# Patient Record
Sex: Female | Born: 1965 | Race: White | Hispanic: No | Marital: Married | Smoking: Never smoker
Health system: Southern US, Community
[De-identification: ages and names within clinical notes are randomized; demographics above are authoritative.]

## PROBLEM LIST (undated history)

## (undated) DIAGNOSIS — F419 Anxiety disorder, unspecified: Secondary | ICD-10-CM

## (undated) DIAGNOSIS — Z85828 Personal history of other malignant neoplasm of skin: Secondary | ICD-10-CM

## (undated) DIAGNOSIS — E785 Hyperlipidemia, unspecified: Secondary | ICD-10-CM

## (undated) DIAGNOSIS — I10 Essential (primary) hypertension: Secondary | ICD-10-CM

## (undated) DIAGNOSIS — E119 Type 2 diabetes mellitus without complications: Secondary | ICD-10-CM

## (undated) DIAGNOSIS — E559 Vitamin D deficiency, unspecified: Secondary | ICD-10-CM

## (undated) DIAGNOSIS — C4490 Unspecified malignant neoplasm of skin, unspecified: Secondary | ICD-10-CM

## (undated) DIAGNOSIS — T7840XA Allergy, unspecified, initial encounter: Secondary | ICD-10-CM

## (undated) DIAGNOSIS — M2550 Pain in unspecified joint: Secondary | ICD-10-CM

## (undated) DIAGNOSIS — K5900 Constipation, unspecified: Secondary | ICD-10-CM

## (undated) DIAGNOSIS — Z9889 Other specified postprocedural states: Secondary | ICD-10-CM

## (undated) DIAGNOSIS — R7303 Prediabetes: Secondary | ICD-10-CM

## (undated) HISTORY — DX: Type 2 diabetes mellitus without complications: E11.9

## (undated) HISTORY — PX: SCLERAL BUCKLE: SHX5340

## (undated) HISTORY — PX: WRIST SURGERY: SHX841

## (undated) HISTORY — PX: SKIN CANCER EXCISION: SHX779

## (undated) HISTORY — DX: Pain in unspecified joint: M25.50

## (undated) HISTORY — PX: OTHER SURGICAL HISTORY: SHX169

## (undated) HISTORY — DX: Anxiety disorder, unspecified: F41.9

## (undated) HISTORY — DX: Essential (primary) hypertension: I10

## (undated) HISTORY — PX: VARICOSE VEIN SURGERY: SHX832

## (undated) HISTORY — DX: Allergy, unspecified, initial encounter: T78.40XA

## (undated) HISTORY — DX: Personal history of other malignant neoplasm of skin: Z85.828

## (undated) HISTORY — DX: Unspecified malignant neoplasm of skin, unspecified: C44.90

## (undated) HISTORY — DX: Constipation, unspecified: K59.00

## (undated) HISTORY — DX: Other specified postprocedural states: Z98.890

## (undated) HISTORY — PX: FRACTURE SURGERY: SHX138

## (undated) HISTORY — DX: Hyperlipidemia, unspecified: E78.5

## (undated) HISTORY — DX: Vitamin D deficiency, unspecified: E55.9

## (undated) HISTORY — PX: BREAST SURGERY: SHX581

---

## 2005-12-02 ENCOUNTER — Ambulatory Visit: Payer: Self-pay | Admitting: Family Medicine

## 2006-04-19 ENCOUNTER — Ambulatory Visit: Payer: Self-pay | Admitting: Family Medicine

## 2007-12-14 ENCOUNTER — Encounter (INDEPENDENT_AMBULATORY_CARE_PROVIDER_SITE_OTHER): Payer: Self-pay | Admitting: *Deleted

## 2010-12-06 ENCOUNTER — Ambulatory Visit: Payer: Self-pay | Admitting: General Surgery

## 2012-06-07 IMAGING — CR DG OUTSIDE FILMS CHEST
7 of 8 series · 7 of 8 positions shown · non-contrast
Comparison: none

[CC (1 of 7)]
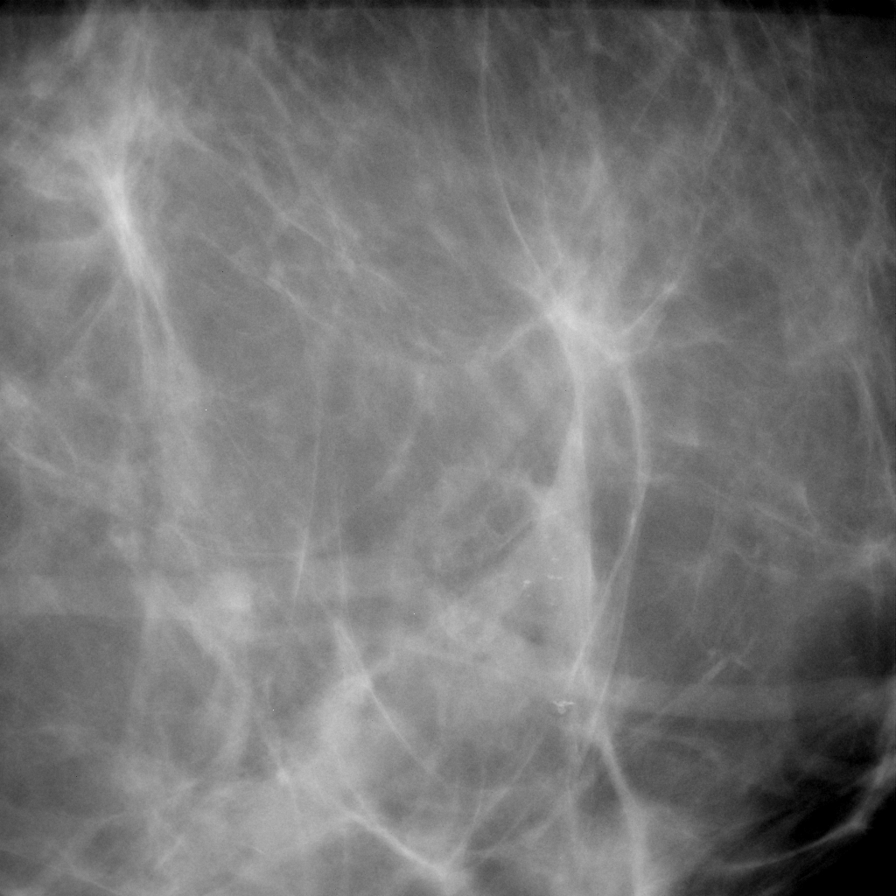

[CC (2 of 7)]
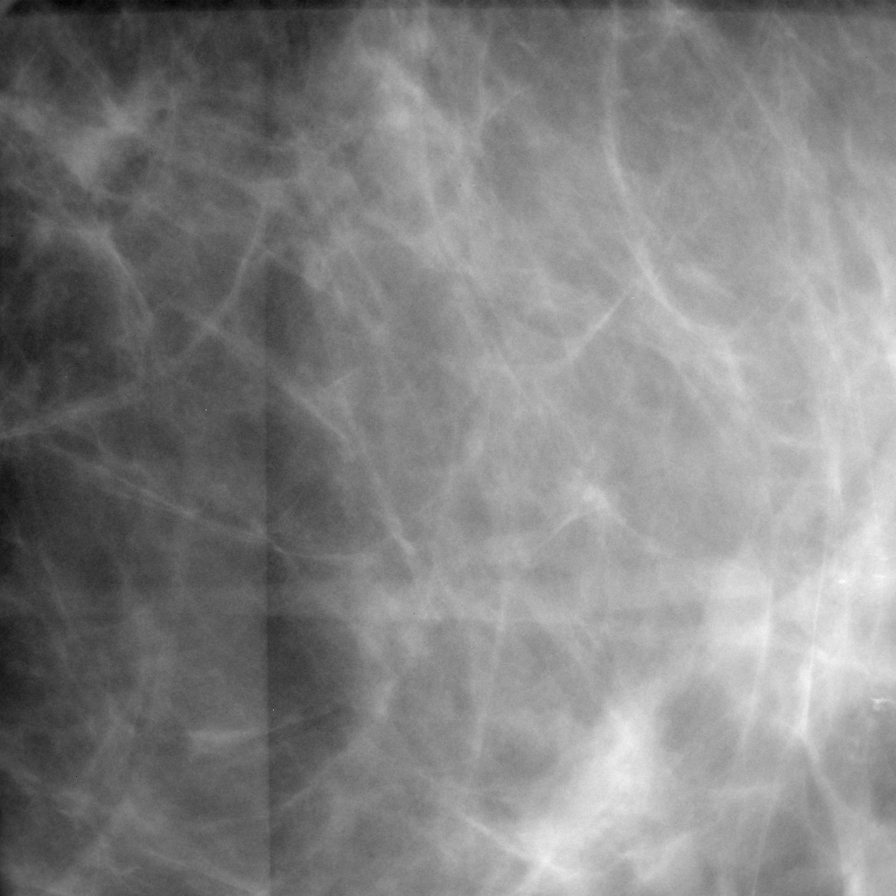

[CC (3 of 7)]
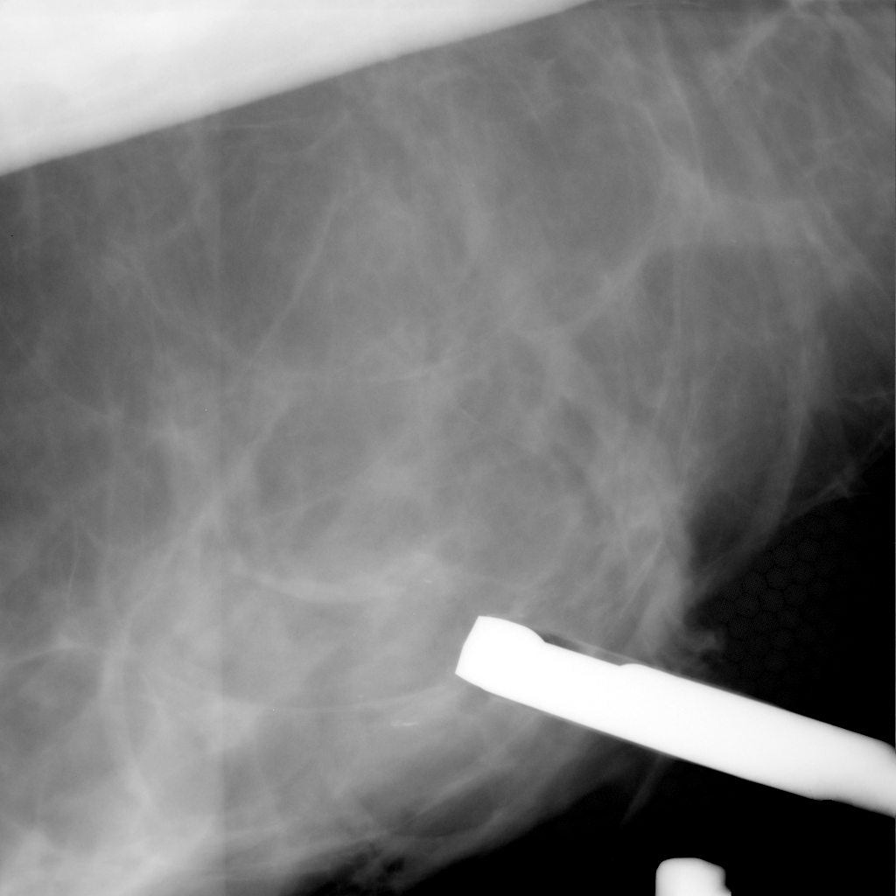

[CC (4 of 7)]
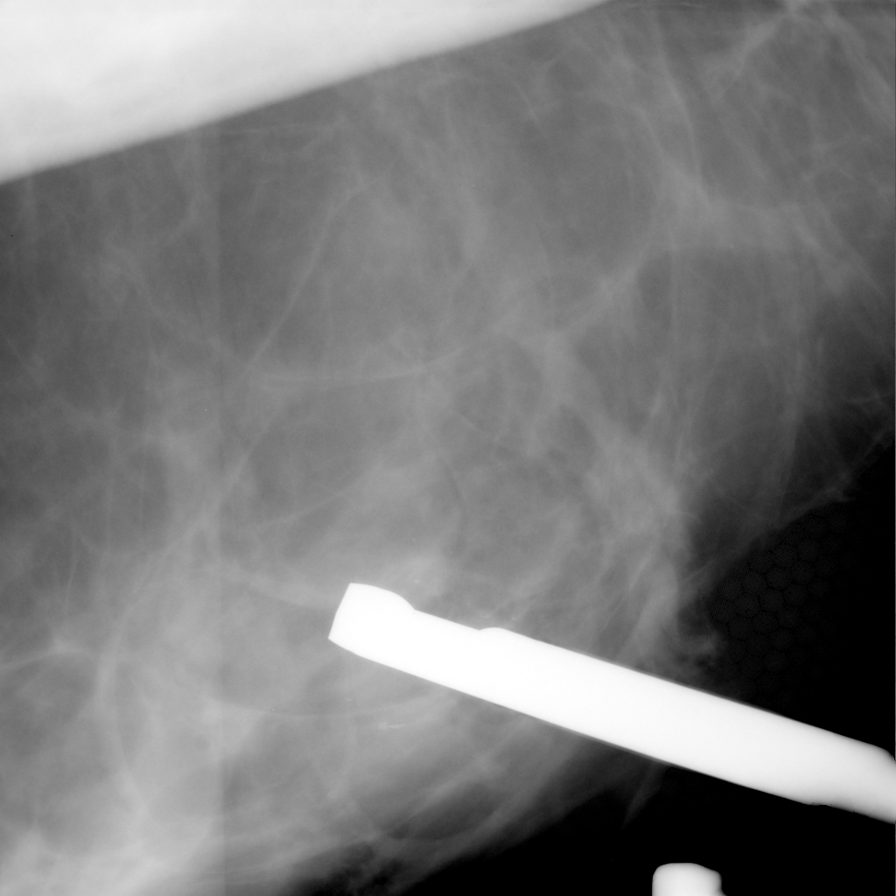

[CC (5 of 7)]
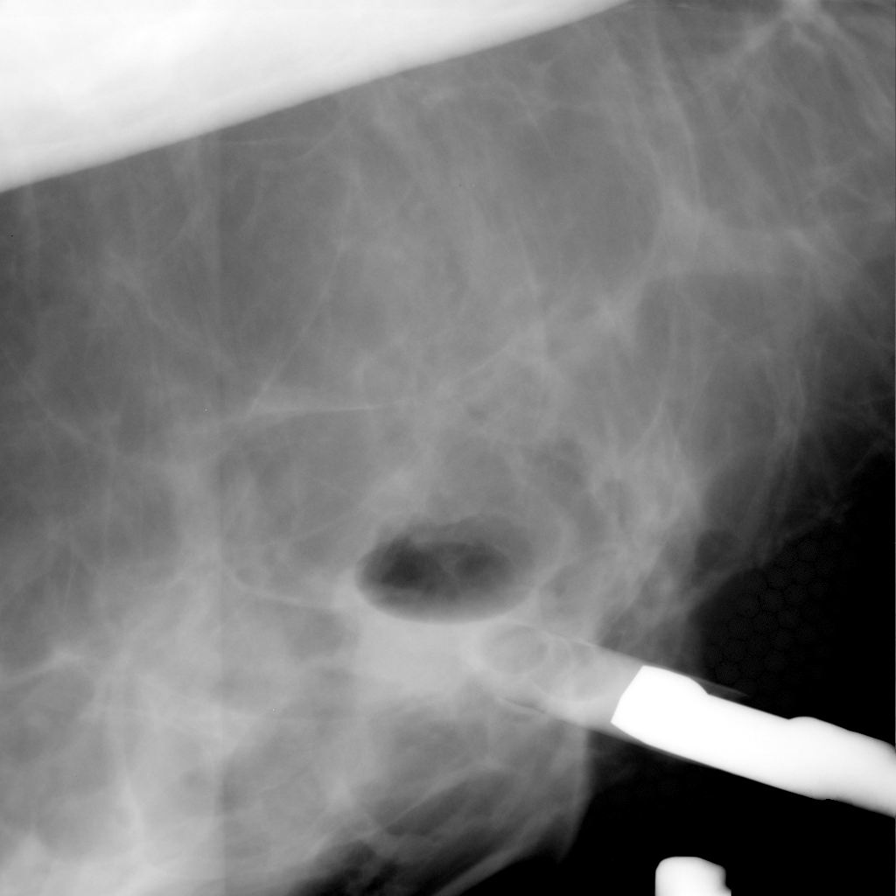

[CC (6 of 7)]
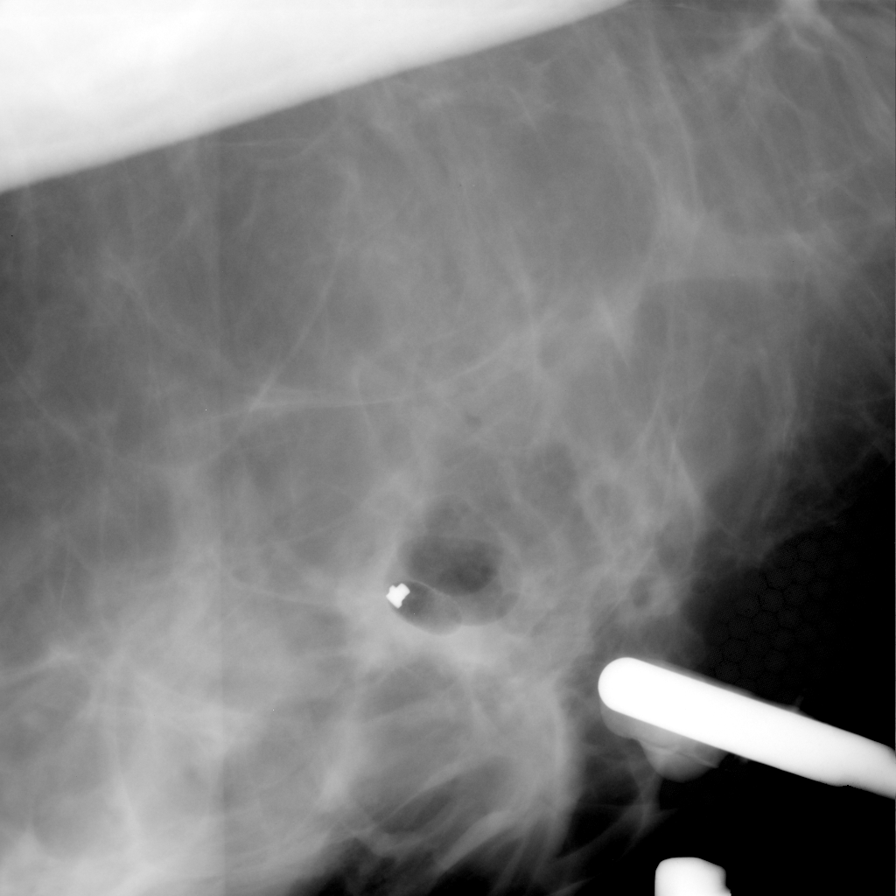

[CC (7 of 7)]
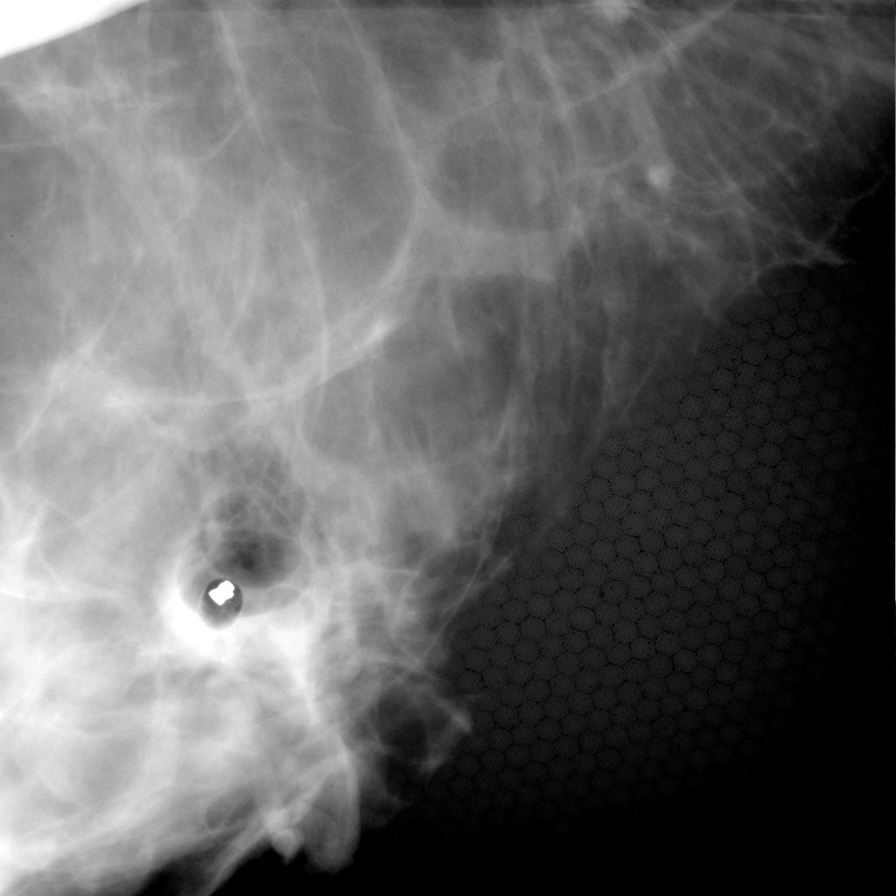

[7 of 8 positions shown; findings below may reference images not displayed]

**** An original report or order could not be provided from the [HOSPITAL] [facility] ****

## 2012-12-31 ENCOUNTER — Encounter: Payer: Self-pay | Admitting: General Surgery

## 2016-08-02 ENCOUNTER — Telehealth: Payer: Self-pay

## 2016-08-02 NOTE — Telephone Encounter (Signed)
Pt aware and will check on name of medication and get back to [place].

## 2016-08-02 NOTE — Telephone Encounter (Signed)
Pt can research vaccinations on CDC.gov website and get at [facility]. Pt to get me name of Rx for altitude sickness--unfamiliar with it. RN to notify pt.

## 2016-08-02 NOTE — Telephone Encounter (Signed)
Pt is planning a family trip to [place] in July and would like recommendations for immunizations that she may need as well as an rx for altitude sickness, she was unsure of the name but her daughter got an rx for it since she will be there most of the summer. Please advise. cb# [phone] thank you

## 2016-08-09 ENCOUNTER — Telehealth: Payer: Self-pay | Admitting: Obstetrics and Gynecology

## 2016-08-09 NOTE — Telephone Encounter (Signed)
RN to notify pt to check at [email or web address] for immunization recommendations and f/u with [facility] if she needs them. (I think she asked about this on previous msg as well).

## 2016-08-09 NOTE — Telephone Encounter (Signed)
Pt stated that she is going to be going out of the country and wanted to see if she was due for any immunizations. Please advise. Thanks TNP

## 2016-08-10 NOTE — Telephone Encounter (Signed)
Pt aware.

## 2016-08-10 NOTE — Telephone Encounter (Signed)
I do not know of a state database. I had advised her to contact the [facility] for recommendations since they have more knowledge regarding immunizations.

## 2016-08-10 NOTE — Telephone Encounter (Signed)
Pt states you was going to call her back after you checked the states database?

## 2016-10-13 ENCOUNTER — Ambulatory Visit (INDEPENDENT_AMBULATORY_CARE_PROVIDER_SITE_OTHER): Payer: Self-pay | Admitting: Vascular Surgery

## 2016-10-18 ENCOUNTER — Encounter: Payer: Self-pay | Admitting: Family

## 2016-10-18 ENCOUNTER — Ambulatory Visit (INDEPENDENT_AMBULATORY_CARE_PROVIDER_SITE_OTHER): Payer: Commercial Managed Care - PPO | Admitting: Family

## 2016-10-18 VITALS — BP 130/80 | HR 78 | Temp 98.7°F | Ht 67.0 in | Wt 246.6 lb

## 2016-10-18 DIAGNOSIS — E118 Type 2 diabetes mellitus with unspecified complications: Secondary | ICD-10-CM | POA: Diagnosis not present

## 2016-10-18 DIAGNOSIS — I10 Essential (primary) hypertension: Secondary | ICD-10-CM | POA: Diagnosis not present

## 2016-10-18 DIAGNOSIS — F411 Generalized anxiety disorder: Secondary | ICD-10-CM

## 2016-10-18 DIAGNOSIS — E119 Type 2 diabetes mellitus without complications: Secondary | ICD-10-CM | POA: Insufficient documentation

## 2016-10-18 LAB — BASIC METABOLIC PANEL
BUN: 10 mg/dL (ref 6–23)
CALCIUM: 9.1 mg/dL (ref 8.4–10.5)
CO2: 26 mEq/L (ref 19–32)
Chloride: 105 mEq/L (ref 96–112)
Creatinine, Ser: 0.59 mg/dL (ref 0.40–1.20)
GFR: 114.94 mL/min (ref >60.00)
GLUCOSE: 111 mg/dL — AB (ref 70–99)
POTASSIUM: 4.2 meq/L (ref 3.5–5.1)
Sodium: 137 mEq/L (ref 135–145)

## 2016-10-18 LAB — HEMOGLOBIN A1C: Hgb A1c MFr Bld: 6 % (ref 4.6–6.5)

## 2016-10-18 MED ORDER — AMLODIPINE BESYLATE 2.5 MG PO TABS: 2.5000 mg | ORAL_TABLET | Freq: Every day | ORAL | 0 refills | Status: DC

## 2016-10-18 NOTE — Assessment & Plan Note (Signed)
Elevated today however then came down after resting in room. Based on history, it appears to be  waxing and waning. Patient is very vigilant with blood pressure and I gave for threshold 130/80, and if persistently elevated, I would like her to start a low-dose amlodipine. Advised low salt, exercise as borderline, she may be able to treat with lifestyle modifications. Advised follow-up in 3 months.

## 2016-10-18 NOTE — Assessment & Plan Note (Signed)
Pending A1c. Appears controlled 

## 2016-10-18 NOTE — Progress Notes (Signed)
Subjective:    Patient ID: Mia Oconnell, female    DOB: [birth year], 51 y.o.   MRN: [id number]  CC: Mia Oconnell is a 51 y.o. female who presents today to establish care.    HPI: DM II- compliant with metformin. Doesn't check blood sugars.  anxiety- started years ago zoloft. No depression. No thoughts of hurting herself or anyone else.   H/o htn- had been on lisinopril a year ago for HTN. Ranges 146/90 to 102/80. Denies exertional chest pain or pressure, numbness or tingling radiating to left arm or jaw, palpitations, dizziness, frequent headaches, changes in vision, or shortness of breath.    6 with [facility] for pap, mammogram      HISTORY:  Past Medical History:  Diagnosis Date  . Allergy   . Diabetes mellitus without complication ([place])   . Hx of varicose vein stripping   . Hyperlipidemia   . Hypertension    Past Surgical History:  Procedure Laterality Date  . BREAST SURGERY    . SCLERAL BUCKLE    . VARICOSE VEIN SURGERY     Family History  Problem Relation Age of Onset  . Hyperlipidemia Mother   . Hypertension Mother   . Hyperlipidemia Father   . Hypertension Father   . Alcohol abuse Maternal Uncle   . Hyperlipidemia Maternal Grandmother   . Hypertension Maternal Grandmother   . Hyperlipidemia Maternal Grandfather   . Heart disease Maternal Grandfather   . Hypertension Maternal Grandfather   . Hyperlipidemia Paternal Grandmother   . Hypertension Paternal Grandmother   . Cancer Paternal Grandfather        colon  . Hyperlipidemia Paternal Grandfather   . Hypertension Paternal Grandfather     Allergies: Lisinopril No current outpatient prescriptions on file prior to visit.   No current facility-administered medications on file prior to visit.     Social History  Substance Use Topics  . Smoking status: Never Smoker  . Smokeless tobacco: Never Used  . Alcohol use No    Review of Systems  Constitutional: Negative for chills and  fever.  Respiratory: Negative for cough.   Cardiovascular: Negative for chest pain and palpitations.  Gastrointestinal: Negative for nausea and vomiting.  Psychiatric/Behavioral: Negative for suicidal ideas. The patient is nervous/anxious.       Objective:    BP 130/80   Pulse 78   Temp 98.7 F (37.1 C) (Oral)   Ht 5\' 7"  (1.702 m)   Wt 246 lb 9.6 oz (111.9 kg)   SpO2 98%   BMI 38.62 kg/m  BP Readings from Last 3 Encounters:  10/18/16 130/80   Wt Readings from Last 3 Encounters:  10/18/16 246 lb 9.6 oz (111.9 kg)    Physical Exam  Constitutional: She appears well-developed and well-nourished.  Eyes: Conjunctivae are normal.  Cardiovascular: Normal rate, regular rhythm, normal heart sounds and normal pulses.   Pulmonary/Chest: Effort normal and breath sounds normal. She has no wheezes. She has no rhonchi. She has no rales.  Neurological: She is alert.  Skin: Skin is warm and dry.  Psychiatric: She has a normal mood and affect. Her speech is normal and behavior is normal. Thought content normal.  Vitals reviewed.      Assessment & Plan:   Problem List Items Addressed This Visit      Cardiovascular and Mediastinum   Hypertension - Primary    Elevated today however then came down after resting in room. Based on history, it appears to be  waxing and waning. Patient is very vigilant with blood pressure and I gave for threshold 130/80, and if persistently elevated, I would like her to start a low-dose amlodipine. Advised low salt, exercise as borderline, she may be able to treat with lifestyle modifications. Advised follow-up in 3 months.                                                Relevant Medications   atorvastatin (LIPITOR) 20 MG tablet   aspirin 81 MG tablet   amLODipine (NORVASC) 2.5 MG tablet   Other Relevant Orders   Basic metabolic panel   Hemoglobin A1c     Endocrine   DM (diabetes mellitus) (HCC)    Pending A1c. Appears controlled.      Relevant  Medications   atorvastatin (LIPITOR) 20 MG tablet   metFORMIN (GLUCOPHAGE-XR) 500 MG 24 hr tablet   aspirin 81 MG tablet     Other   GAD (generalized anxiety disorder)    Doing well on Zoloft. We'll continue at this time.          I am having [name] start on amLODipine. I am also having her maintain her atorvastatin, metFORMIN, sertraline, aspirin, and cyanocobalamin.   Meds ordered this encounter  Medications  . atorvastatin (LIPITOR) 20 MG tablet    Sig: Take 20 mg by mouth daily.    Refill:  3  . metFORMIN (GLUCOPHAGE-XR) 500 MG 24 hr tablet    Sig: TAKE 1 TABLET EVERY DAY WITH THE EVENING MEAL    Refill:  3  . sertraline (ZOLOFT) 50 MG tablet    Sig: Take 50 mg by mouth daily.    Refill:  3  . aspirin 81 MG tablet    Sig: Take 81 mg by mouth daily.  . cyanocobalamin 2000 MCG tablet    Sig: Take 2,000 mcg by mouth every other day.  Marland Kitchen amLODipine (NORVASC) 2.5 MG tablet    Sig: Take 1 tablet (2.5 mg total) by mouth daily.    Dispense:  30 tablet    Refill:  0    Order Specific Question:   Supervising Provider    Answer:   Crecencio Mc [[id number]]    Return precautions given.   Risks, benefits, and alternatives of the medications and treatment plan prescribed today were discussed, and patient expressed understanding.   Education regarding symptom management and diagnosis given to patient on AVS.  Continue to follow with Burnard Hawthorne, FNP for routine health maintenance.   Mia Oconnell and I agreed with plan.   Mable Paris, FNP

## 2016-10-18 NOTE — Assessment & Plan Note (Addendum)
Doing well on Zoloft. We'll continue at this time.

## 2016-10-18 NOTE — Patient Instructions (Addendum)
Goal of blood pressure is less than 130/80. If higher than that, you may take amlodipine.   Follow up 3 months  Labs today

## 2016-10-18 NOTE — Progress Notes (Signed)
Pre visit review using our clinic review tool, if applicable. No additional management support is needed unless otherwise documented below in the visit note. 

## 2016-10-24 ENCOUNTER — Ambulatory Visit (INDEPENDENT_AMBULATORY_CARE_PROVIDER_SITE_OTHER): Payer: Commercial Managed Care - PPO | Admitting: Vascular Surgery

## 2016-10-24 ENCOUNTER — Encounter (INDEPENDENT_AMBULATORY_CARE_PROVIDER_SITE_OTHER): Payer: Self-pay | Admitting: Vascular Surgery

## 2016-10-24 VITALS — BP 135/88 | HR 78 | Resp 17 | Ht 67.0 in | Wt 247.0 lb

## 2016-10-24 DIAGNOSIS — I872 Venous insufficiency (chronic) (peripheral): Secondary | ICD-10-CM

## 2016-10-24 DIAGNOSIS — I10 Essential (primary) hypertension: Secondary | ICD-10-CM

## 2016-10-24 DIAGNOSIS — E785 Hyperlipidemia, unspecified: Secondary | ICD-10-CM | POA: Insufficient documentation

## 2016-10-24 DIAGNOSIS — M79606 Pain in leg, unspecified: Secondary | ICD-10-CM | POA: Insufficient documentation

## 2016-10-24 DIAGNOSIS — M79604 Pain in right leg: Secondary | ICD-10-CM | POA: Diagnosis not present

## 2016-10-24 DIAGNOSIS — M79605 Pain in left leg: Secondary | ICD-10-CM | POA: Diagnosis not present

## 2016-10-24 DIAGNOSIS — E782 Mixed hyperlipidemia: Secondary | ICD-10-CM

## 2016-10-24 DIAGNOSIS — I83813 Varicose veins of bilateral lower extremities with pain: Secondary | ICD-10-CM

## 2016-10-24 NOTE — Progress Notes (Signed)
MRN : [id number]  Mia Oconnell is a 51 y.o. (14-Jul-1965) female who presents with chief complaint of  Chief Complaint  Patient presents with  . [place]    Painful varicose veins  .  History of Present Illness: The patient is seen for evaluation of symptomatic varicose veins. The patient relates burning and stinging which worsened steadily throughout the course of the day, particularly with standing. The patient also notes an aching and throbbing pain over the varicosities, particularly with prolonged dependent positions. The symptoms are significantly improved with elevation.  The patient also notes that during hot weather the symptoms are greatly intensified. The patient states the pain from the varicose veins interferes with work, daily exercise, shopping and household maintenance.   Several weeks ago she experienced an area of STP on the right anterior thigh.  She was seen in an urgent care center and treated conservatively.  There is no history of DVT, PE or superficial thrombophlebitis. There is no history of ulceration or hemorrhage. The patient denies a significant family history of varicose veins.   The patient has not worn graduated compression in the past. At the present time the patient has not been using over-the-counter analgesics. There is no history of prior surgical intervention or sclerotherapy.    Current Meds  Medication Sig  . amLODipine (NORVASC) 2.5 MG tablet Take 1 tablet (2.5 mg total) by mouth daily.  Marland Kitchen aspirin 81 MG tablet Take 81 mg by mouth daily.  Marland Kitchen atorvastatin (LIPITOR) 20 MG tablet Take 20 mg by mouth daily.  . cholecalciferol (VITAMIN D) 1000 units tablet Take 8,000 Units by mouth once.  . metFORMIN (GLUCOPHAGE-XR) 500 MG 24 hr tablet TAKE 1 TABLET EVERY DAY WITH THE EVENING MEAL  . sertraline (ZOLOFT) 50 MG tablet Take 50 mg by mouth daily.    Past Medical History:  Diagnosis Date  . Allergy   . Diabetes mellitus without complication ([place])    . Hx of varicose vein stripping   . Hyperlipidemia   . Hypertension     Past Surgical History:  Procedure Laterality Date  . BREAST SURGERY    . SCLERAL BUCKLE    . VARICOSE VEIN SURGERY      Social History Social History  Substance Use Topics  . Smoking status: Never Smoker  . Smokeless tobacco: Never Used  . Alcohol use No    Family History Family History  Problem Relation Age of Onset  . Hyperlipidemia Mother   . Hypertension Mother   . Hyperlipidemia Father   . Hypertension Father   . Alcohol abuse Maternal Uncle   . Hyperlipidemia Maternal Grandmother   . Hypertension Maternal Grandmother   . Hyperlipidemia Maternal Grandfather   . Heart disease Maternal Grandfather   . Hypertension Maternal Grandfather   . Hyperlipidemia Paternal Grandmother   . Hypertension Paternal Grandmother   . Cancer Paternal Grandfather        colon  . Hyperlipidemia Paternal Grandfather   . Hypertension Paternal Grandfather     Allergies  Allergen Reactions  . Lisinopril     cough  . Penicillins   . Sulfa Antibiotics      REVIEW OF SYSTEMS (Negative unless checked)  Constitutional: [] Weight loss  [] Fever  [] Chills Cardiac: [] Chest pain   [] Chest pressure   [] Palpitations   [] Shortness of breath when laying flat   [] Shortness of breath with exertion. Vascular:  [] Pain in legs with walking   [x] Pain in legs with standing  [] History of DVT   []   Phlebitis   [x] Swelling in legs   [x] Varicose veins   [] Non-healing ulcers Pulmonary:   [] Uses home oxygen   [] Productive cough   [] Hemoptysis   [] Wheeze  [] COPD   [] Asthma Neurologic:  [] Dizziness   [] Seizures   [] History of stroke   [] History of TIA  [] Aphasia   [] Vissual changes   [] Weakness or numbness in arm   [] Weakness or numbness in leg Musculoskeletal:   [] Joint swelling   [] Joint pain   [] Low back pain Hematologic:  [] Easy bruising  [] Easy bleeding   [] Hypercoagulable state   [] Anemic Gastrointestinal:  [] Diarrhea   [] Vomiting   [] Gastroesophageal reflux/heartburn   [] Difficulty swallowing. Genitourinary:  [] Chronic kidney disease   [] Difficult urination  [] Frequent urination   [] Blood in urine Skin:  [] Rashes   [] Ulcers  Psychological:  [] History of anxiety   []  History of major depression.  Physical Examination  Vitals:   10/24/16 1408  BP: 135/88  Pulse: 78  Resp: 17  Weight: 247 lb (112 kg)  Height: 5\' 7"  (1.702 m)   Body mass index is 38.69 kg/m. Gen: WD/WN, NAD Head: [place]/AT, No temporalis wasting.  Ear/Nose/Throat: Hearing grossly intact, nares w/o erythema or drainage Eyes: PER, EOMI, sclera nonicteric.  Neck: Supple, no large masses.   Pulmonary:  Good air movement, no audible wheezing bilaterally, no use of accessory muscles.  Cardiac: RRR, no JVD Vascular: scattered varicosities present extensively greater than 5 mm bilaterally.  Mild venous stasis changes to the legs bilaterally.  2+ soft pitting edema Vessel Right Left  Radial Palpable Palpable  Ulnar Palpable Palpable  PT Palpable Palpable  DP Palpable Palpable  Gastrointestinal: Non-distended. No guarding/no peritoneal signs.  Musculoskeletal: M/S 5/5 throughout.  No deformity or atrophy.  Neurologic: CN 2-12 intact. Symmetrical.  Speech is fluent. Motor exam as listed above. Psychiatric: Judgment intact, Mood & affect appropriate for pt's clinical situation. Dermatologic: No rashes or ulcers noted.  No changes consistent with cellulitis. Lymph : No lichenification or skin changes of chronic lymphedema.  CBC No results found for: WBC, HGB, HCT, MCV, PLT  BMET    Component Value Date/Time   NA 137 10/18/2016 1132   K 4.2 10/18/2016 1132   CL 105 10/18/2016 1132   CO2 26 10/18/2016 1132   GLUCOSE 111 (H) 10/18/2016 1132   BUN 10 10/18/2016 1132   CREATININE 0.59 10/18/2016 1132   CALCIUM 9.1 10/18/2016 1132   Estimated Creatinine Clearance: 109.8 mL/min (by C-G formula based on SCr of 0.59 mg/dL).  COAG No results found for:  INR, PROTIME  Radiology No results found.  Assessment/Plan 1. Pain in both lower extremities  Recommend:  The patient is complaining of symptomatic varicose veins.  The patient describes them as painful, associated with swelling of both ankles and notes  this is interfering with daily activities and lifestyle.  I have had a long discussion with the patient regarding  varicose veins and why they cause symptoms.  Patient will begin wearing graduated compression stockings class 1 on a daily basis, beginning first thing in the morning and removing them in the evening. The patient is instructed specifically not to sleep in the stockings.    The patient  will also begin using over-the-counter analgesics such as Motrin 600 mg po TID to help control the symptoms.    In addition, behavioral modification including elevation during the day will be initiated.  The patient is also instructed to continue exercising such as walking 4-5 times per week.   2.  Chronic venous insufficiency No surgery or intervention at this point in time.    I have had a long discussion with the patient regarding venous insufficiency and why it  causes symptoms. I have discussed with the patient the chronic skin changes that accompany venous insufficiency and the long term sequela such as infection and ulceration.  Patient will begin wearing graduated compression stockings class 1 (20-30 mmHg) or compression wraps on a daily basis a prescription was given. The patient will put the stockings on first thing in the morning and removing them in the evening. The patient is instructed specifically not to sleep in the stockings.    In addition, behavioral modification including several periods of elevation of the lower extremities during the day will be continued. I have demonstrated that proper elevation is a position with the ankles at heart level.  The patient is instructed to begin routine exercise, especially walking on a daily  basis  Following the review of the ultrasound the patient will follow up in 2-3 months to reassess the degree of swelling and the control that graduated compression stockings or compression wraps  is offering.   The patient can be assessed for a Lymph Pump at that time  3. Varicose veins of both lower extremities with pain See #1&2  4. Essential hypertension Continue antihypertensive medications as already ordered, these medications have been reviewed and there are no changes at this time.   5. Mixed hyperlipidemia Continue statin as ordered and reviewed, no changes at this time     Hortencia Pilar, MD  10/24/2016 9:10 PM

## 2016-11-01 ENCOUNTER — Encounter (INDEPENDENT_AMBULATORY_CARE_PROVIDER_SITE_OTHER): Payer: Self-pay | Admitting: Vascular Surgery

## 2016-12-05 ENCOUNTER — Ambulatory Visit (INDEPENDENT_AMBULATORY_CARE_PROVIDER_SITE_OTHER): Payer: Commercial Managed Care - PPO | Admitting: Vascular Surgery

## 2016-12-18 ENCOUNTER — Other Ambulatory Visit: Payer: Self-pay | Admitting: Family

## 2016-12-18 DIAGNOSIS — I10 Essential (primary) hypertension: Secondary | ICD-10-CM

## 2017-01-19 ENCOUNTER — Ambulatory Visit: Payer: Commercial Managed Care - PPO | Admitting: Family

## 2017-03-02 ENCOUNTER — Other Ambulatory Visit: Payer: Self-pay | Admitting: Obstetrics and Gynecology

## 2017-03-06 ENCOUNTER — Ambulatory Visit: Payer: Commercial Managed Care - PPO | Admitting: Internal Medicine

## 2017-03-22 ENCOUNTER — Ambulatory Visit: Payer: Commercial Managed Care - PPO | Admitting: Obstetrics and Gynecology

## 2017-04-06 ENCOUNTER — Ambulatory Visit: Payer: Commercial Managed Care - PPO | Admitting: Internal Medicine

## 2017-04-17 ENCOUNTER — Other Ambulatory Visit: Payer: Self-pay | Admitting: Obstetrics and Gynecology

## 2017-05-04 ENCOUNTER — Ambulatory Visit: Payer: Commercial Managed Care - PPO | Admitting: Obstetrics and Gynecology

## 2017-05-16 ENCOUNTER — Other Ambulatory Visit: Payer: Self-pay | Admitting: Obstetrics and Gynecology

## 2017-05-16 MED ORDER — ATORVASTATIN CALCIUM 20 MG PO TABS: 20.0000 mg | ORAL_TABLET | Freq: Every day | ORAL | 0 refills | Status: DC

## 2017-05-29 ENCOUNTER — Other Ambulatory Visit: Payer: Self-pay | Admitting: Obstetrics and Gynecology

## 2017-05-29 MED ORDER — METFORMIN HCL ER 500 MG PO TB24: ORAL_TABLET | ORAL | 0 refills | Status: DC

## 2017-05-29 NOTE — Progress Notes (Signed)
Rx RF till annual/labs.

## 2017-06-19 ENCOUNTER — Ambulatory Visit: Payer: Commercial Managed Care - PPO | Admitting: Obstetrics and Gynecology

## 2017-06-26 ENCOUNTER — Ambulatory Visit (INDEPENDENT_AMBULATORY_CARE_PROVIDER_SITE_OTHER): Payer: Commercial Managed Care - PPO | Admitting: Obstetrics and Gynecology

## 2017-06-26 ENCOUNTER — Encounter: Payer: Self-pay | Admitting: Obstetrics and Gynecology

## 2017-06-26 ENCOUNTER — Other Ambulatory Visit: Payer: Self-pay

## 2017-06-26 VITALS — BP 122/84 | HR 88 | Ht 67.0 in | Wt 248.0 lb

## 2017-06-26 DIAGNOSIS — Z1231 Encounter for screening mammogram for malignant neoplasm of breast: Secondary | ICD-10-CM | POA: Diagnosis not present

## 2017-06-26 DIAGNOSIS — Z01419 Encounter for gynecological examination (general) (routine) without abnormal findings: Secondary | ICD-10-CM

## 2017-06-26 DIAGNOSIS — E119 Type 2 diabetes mellitus without complications: Secondary | ICD-10-CM | POA: Insufficient documentation

## 2017-06-26 DIAGNOSIS — N938 Other specified abnormal uterine and vaginal bleeding: Secondary | ICD-10-CM

## 2017-06-26 DIAGNOSIS — Z760 Encounter for issue of repeat prescription: Secondary | ICD-10-CM

## 2017-06-26 DIAGNOSIS — F419 Anxiety disorder, unspecified: Secondary | ICD-10-CM | POA: Diagnosis not present

## 2017-06-26 DIAGNOSIS — Z1211 Encounter for screening for malignant neoplasm of colon: Secondary | ICD-10-CM | POA: Diagnosis not present

## 2017-06-26 DIAGNOSIS — Z1239 Encounter for other screening for malignant neoplasm of breast: Secondary | ICD-10-CM

## 2017-06-26 MED ORDER — METFORMIN HCL ER 500 MG PO TB24: ORAL_TABLET | ORAL | 0 refills | Status: DC

## 2017-06-26 MED ORDER — SERTRALINE HCL 50 MG PO TABS: 50.0000 mg | ORAL_TABLET | Freq: Every day | ORAL | 3 refills | Status: DC

## 2017-06-26 MED ORDER — ATORVASTATIN CALCIUM 20 MG PO TABS: 20.0000 mg | ORAL_TABLET | Freq: Every day | ORAL | 0 refills | Status: DC

## 2017-06-26 NOTE — Patient Instructions (Addendum)
I value your feedback and entrusting us with your care. If you get a [facility] [address] patient survey, I would appreciate you taking the time to let us know about your experience today. Thank you! 

## 2017-06-26 NOTE — Progress Notes (Signed)
PCP: Burnard Hawthorne, FNP   Chief Complaint  Patient presents with  . Gynecologic Exam    No Complaints    HPI:      Ms. Mia Oconnell is a 52 y.o. G0P0000 who LMP was Patient's last menstrual period was 06/20/2017., presents today for her annual examination.  Her menses are regular every 28-30 days, lasting 4 days, med flow.  Dysmenorrhea none. She has had intermenstrual bleeding, lasting 3-4 days, for the past 4-5 months.  Sex activity: single partner, contraception - condoms. She does not have vaginal dryness.  Last Pap: March 09, 2015  Results were: no abnormalities /neg HPV DNA.  Hx of STDs: none  Last mammogram: 03/28/16, [facility] 2 There is no FH of breast cancer. There is no FH of ovarian cancer. The patient does do self-breast exams.  Colonoscopy: never; due this yr  Tobacco use: The patient denies current or previous tobacco use. Alcohol use: none Exercise: moderately active  She does get adequate calcium and Vitamin D in her diet.  Labs/med problems managed by PCP now. Has appt in 6/19 and pt needs 1 more mo Rx to get her to appt. She is on atorvastatin for hyperlipidemia and metformin for type 2 DM. Pt was on lisinopril for HTN/kidney protection due to DM, but developed ACEI cough. Pt's BP has been great recently. She has norvasc prn sx.   Pt takes sertraline 50 mg daily for anxiety with sx control, and I manage this. Wants to cont meds.   Past Medical History:  Diagnosis Date  . Allergy   . Anxiety   . Diabetes mellitus without complication ([place])   . Hx of varicose vein stripping   . Hyperlipidemia   . Hypertension   . Vitamin D deficiency     Past Surgical History:  Procedure Laterality Date  . BREAST SURGERY    . SCLERAL BUCKLE    . VARICOSE VEIN SURGERY      Family History  Problem Relation Age of Onset  . Hyperlipidemia Mother   . Hypertension Mother   . Basal cell carcinoma Mother 72  . Hyperlipidemia Father   . Hypertension Father     . Alcohol abuse Maternal Uncle   . Hyperlipidemia Maternal Grandmother   . Hypertension Maternal Grandmother   . Hyperlipidemia Maternal Grandfather   . Heart disease Maternal Grandfather   . Hypertension Maternal Grandfather   . Hyperlipidemia Paternal Grandmother   . Hypertension Paternal Grandmother   . Cancer Paternal Grandfather        colon  . Hyperlipidemia Paternal Grandfather   . Hypertension Paternal Grandfather     Social History   Socioeconomic History  . Marital status: Married    Spouse name: Not on file  . Number of children: Not on file  . Years of education: Not on file  . Highest education level: Not on file  Occupational History  . Not on file  Social Needs  . Financial resource strain: Not on file  . Food insecurity:    Worry: Not on file    Inability: Not on file  . Transportation needs:    Medical: Not on file    Non-medical: Not on file  Tobacco Use  . Smoking status: Never Smoker  . Smokeless tobacco: Never Used  Substance and Sexual Activity  . Alcohol use: No  . Drug use: No  . Sexual activity: Never  Lifestyle  . Physical activity:    Days per week: 0 days  Minutes per session: Not on file  . Stress: Not on file  Relationships  . Social connections:    Talks on phone: Not on file    Gets together: Not on file    Attends religious service: Not on file    Active member of club or organization: Not on file    Attends meetings of clubs or organizations: Not on file    Relationship status: Not on file  . Intimate partner violence:    Fear of current or ex partner: Not on file    Emotionally abused: Not on file    Physically abused: Not on file    Forced sexual activity: Not on file  Other Topics Concern  . 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 cholecalciferol (VITAMIN D) 1000 units tablet Take 8,000 Units  by mouth once.    . loratadine (CLARITIN) 10 MG tablet Take 10 mg by mouth daily.    Marland Kitchen atorvastatin (LIPITOR) 20 MG tablet Take 1 tablet (20 mg total) by mouth daily. 90 tablet 0  . metFORMIN (GLUCOPHAGE-XR) 500 MG 24 hr tablet TAKE 1 TABLET EVERY DAY WITH THE EVENING MEAL 30 tablet 0  . sertraline (ZOLOFT) 50 MG tablet TAKE 1 TABLET EVERY DAY 90 tablet 0  . amLODipine (NORVASC) 2.5 MG tablet TAKE 1 TABLET BY MOUTH EVERY DAY (Patient not taking: Reported on 06/26/2017) 30 tablet 0  . aspirin 81 MG tablet Take 81 mg by mouth daily.     No facility-administered medications prior to visit.        ROS:  Review of Systems  Constitutional: Negative for fatigue, fever and unexpected weight change.  Respiratory: Negative for cough, shortness of breath and wheezing.   Cardiovascular: Negative for chest pain, palpitations and leg swelling.  Gastrointestinal: Negative for blood in stool, constipation, diarrhea, nausea and vomiting.  Endocrine: Negative for cold intolerance, heat intolerance and polyuria.  Genitourinary: Negative for dyspareunia, dysuria, flank pain, frequency, genital sores, hematuria, menstrual problem, pelvic pain, urgency, vaginal bleeding, vaginal discharge and vaginal pain.  Musculoskeletal: Negative for back pain, joint swelling and myalgias.  Skin: Negative for rash.  Neurological: Negative for dizziness, syncope, light-headedness, numbness and headaches.  Hematological: Negative for adenopathy.  Psychiatric/Behavioral: Negative for agitation, confusion, sleep disturbance and suicidal ideas. The patient is not nervous/anxious.    BREAST: No symptoms    Objective: BP 122/84 (BP Location: Left Arm, Patient Position: Sitting, Cuff Size: Large)   Pulse 88   Ht 5\' 7"  (1.702 m)   Wt 248 lb (112.5 kg)   LMP 06/20/2017   BMI 38.84 kg/m    Physical Exam  Constitutional: She is oriented to person, place, and time. She appears well-developed and well-nourished.   Genitourinary: Vagina normal and uterus normal. There is no rash or tenderness on the right labia. There is no rash or tenderness on the left labia. No erythema or tenderness in the vagina. No vaginal discharge found. Right adnexum does not display mass and does not display tenderness. Left adnexum does not display mass and does not display tenderness. Cervix does not exhibit motion tenderness or polyp. Uterus is not enlarged or tender.  Neck: Normal range of motion. No thyromegaly present.  Cardiovascular: Normal rate, regular rhythm and normal heart sounds.  No murmur heard. Pulmonary/Chest: Effort normal and breath sounds normal. Right breast exhibits  no mass, no nipple discharge, no skin change and no tenderness. Left breast exhibits no mass, no nipple discharge, no skin change and no tenderness.  Abdominal: Soft. There is no tenderness. There is no guarding.  Musculoskeletal: Normal range of motion.  Neurological: She is alert and oriented to person, place, and time. No cranial nerve deficit.  Psychiatric: She has a normal mood and affect. Her behavior is normal.  Vitals reviewed.  Assessment/Plan:  Encounter for annual routine gynecological examination  Screening for breast cancer - Pt to sched mammo. - Plan: MM DIGITAL SCREENING BILATERAL  DUB (dysfunctional uterine bleeding) - Check labs and GYN u/s. Will call with results. - Plan: TSH + free T4, Prolactin, US PELVIS TRANSVANGINAL NON-OB (TV ONLY)  Anxiety - Rx RF zoloft. F/u prn.  - Plan: sertraline (ZOLOFT) 50 MG tablet  Screening for colon cancer - Refer to GI for scr colonoscopy due to age.  - Plan: Ambulatory referral to Gastroenterology  Prescription refill - Rx RF metformin and atorvastatin until pt f/u with PCP for labs/mgmt 6/19 - Plan: metFORMIN (GLUCOPHAGE-XR) 500 MG 24 hr tablet, atorvastatin (LIPITOR) 20 MG tablet   Meds ordered this encounter  Medications  . sertraline (ZOLOFT) 50 MG tablet    Sig: Take 1 tablet  (50 mg total) by mouth daily.    Dispense:  90 tablet    Refill:  3    Order Specific Question:   Supervising Provider    Answer:   Gae Dry [id number]  . metFORMIN (GLUCOPHAGE-XR) 500 MG 24 hr tablet    Sig: TAKE 1 TABLET EVERY DAY WITH THE EVENING MEAL    Dispense:  30 tablet    Refill:  0    Order Specific Question:   Supervising Provider    Answer:   Gae Dry [id number]  . atorvastatin (LIPITOR) 20 MG tablet    Sig: Take 1 tablet (20 mg total) by mouth daily.    Dispense:  30 tablet    Refill:  0    Order Specific Question:   Supervising Provider    Answer:   Gae Dry [[id number]]          GYN counsel breast self exam, mammography screening, menopause, adequate intake of calcium and vitamin D, diet and exercise    F/U  Return in about 1 day (around 06/27/2017) for GYN u/s for DUB.  Alicia B. Copland, PA-C 06/26/2017 11:25 AM

## 2017-06-27 LAB — TSH+FREE T4
FREE T4: 0.91 ng/dL (ref 0.82–1.77)
TSH: 2.39 u[IU]/mL (ref 0.450–4.500)

## 2017-06-27 LAB — PROLACTIN: Prolactin: 7 ng/mL (ref 4.8–23.3)

## 2017-06-29 ENCOUNTER — Telehealth: Payer: Self-pay | Admitting: Obstetrics and Gynecology

## 2017-06-29 ENCOUNTER — Ambulatory Visit (INDEPENDENT_AMBULATORY_CARE_PROVIDER_SITE_OTHER): Payer: Commercial Managed Care - PPO

## 2017-06-29 ENCOUNTER — Other Ambulatory Visit: Payer: Self-pay

## 2017-06-29 DIAGNOSIS — Z1211 Encounter for screening for malignant neoplasm of colon: Secondary | ICD-10-CM

## 2017-06-29 DIAGNOSIS — N938 Other specified abnormal uterine and vaginal bleeding: Secondary | ICD-10-CM | POA: Diagnosis not present

## 2017-06-29 NOTE — Telephone Encounter (Signed)
Pt aware of u/s results. Has had DUB for the past 4-5 months. Neg labs. Suggested EMB due to EM thickness. Pt to RTO with bleeding for procedure. IF neg, pt not bothered by sx and can follow sx expectantly. Pt understands.    ULTRASOUND REPORT  Location: [facility]  Date of Service: 06/29/2017    Indications:DUB Findings:  The uterus is anteverted and measures 10.07 x 6.11 x 4.20cm. Echo texture is heterogenous with evidence of focal mass. Within the uterus is a single suspected fibroid measuring: Fibroid 1: Anterior body, intramural, 1.86 x 1.26cm  The Endometrium measures 7.46 mm.  Right Ovary measures 2.96 x 2.06 x 1.97 cm. It is normal in appearance. Left Ovary measures 2.92 x 2.10 x 1.52 cm. It is normal in appearance. Survey of the adnexa demonstrates no adnexal masses. There is no free fluid in the cul de sac.  Impression: 1. Heterogeneous uterus 2. Anterior body fibroid  Recommendations: 1.Clinical correlation with the patient's History and Physical Exam.   Edwena Bunde, RDMS, RVT

## 2017-06-30 ENCOUNTER — Other Ambulatory Visit: Payer: Self-pay

## 2017-06-30 ENCOUNTER — Encounter: Payer: Self-pay | Admitting: Obstetrics and Gynecology

## 2017-06-30 ENCOUNTER — Telehealth: Payer: Self-pay

## 2017-06-30 NOTE — Telephone Encounter (Signed)
Gastroenterology Pre-Procedure Review  Request Date:  Requesting Physician: Dr.   PATIENT REVIEW QUESTIONS: The patient responded to the following health history questions as indicated:    1. Are you having any GI issues? no 2. Do you have a personal history of Polyps? no 3. Do you have a family history of Colon Cancer or Polyps? yes (Grandfather polyps) 4. Diabetes Mellitus? no 5. Joint replacements in the past 12 months?no 6. Major health problems in the past 3 months?no 7. Any artificial heart valves, MVP, or defibrillator?no    MEDICATIONS & ALLERGIES:    Patient reports the following regarding taking any anticoagulation/antiplatelet therapy:   Plavix, Coumadin, Eliquis, Xarelto, Lovenox, Pradaxa, Brilinta, or Effient? no Aspirin? no  Patient confirms/reports the following medications:  Current Outpatient Medications  Medication Sig Dispense Refill  . amLODipine (NORVASC) 2.5 MG tablet TAKE 1 TABLET BY MOUTH EVERY DAY (Patient not taking: Reported on 06/26/2017) 30 tablet 0  . aspirin 81 MG tablet Take 81 mg by mouth daily.    Marland Kitchen atorvastatin (LIPITOR) 20 MG tablet Take 1 tablet (20 mg total) by mouth daily. 30 tablet 0  . cholecalciferol (VITAMIN D) 1000 units tablet Take 8,000 Units by mouth once.    . loratadine (CLARITIN) 10 MG tablet Take 10 mg by mouth daily.    . metFORMIN (GLUCOPHAGE-XR) 500 MG 24 hr tablet TAKE 1 TABLET EVERY DAY WITH THE EVENING MEAL 30 tablet 0  . sertraline (ZOLOFT) 50 MG tablet Take 1 tablet (50 mg total) by mouth daily. 90 tablet 3   No current facility-administered medications for this visit.     Patient confirms/reports the following allergies:  Allergies  Allergen Reactions  . Lisinopril     cough  . Penicillins   . Sulfa Antibiotics     No orders of the defined types were placed in this encounter.   AUTHORIZATION INFORMATION Primary Insurance: 1D#: Group #:  Secondary Insurance: 1D#: Group #:  SCHEDULE INFORMATION: Date:  08/07/17 Time: Location: [place]

## 2017-07-10 ENCOUNTER — Other Ambulatory Visit: Payer: Self-pay | Admitting: Obstetrics and Gynecology

## 2017-07-11 ENCOUNTER — Encounter: Payer: Self-pay | Admitting: Obstetrics and Gynecology

## 2017-07-14 ENCOUNTER — Encounter: Payer: Self-pay | Admitting: Obstetrics and Gynecology

## 2017-07-14 ENCOUNTER — Ambulatory Visit (INDEPENDENT_AMBULATORY_CARE_PROVIDER_SITE_OTHER): Payer: Commercial Managed Care - PPO | Admitting: Obstetrics and Gynecology

## 2017-07-14 VITALS — BP 132/98 | HR 82 | Ht 67.0 in | Wt 248.0 lb

## 2017-07-14 DIAGNOSIS — N938 Other specified abnormal uterine and vaginal bleeding: Secondary | ICD-10-CM | POA: Insufficient documentation

## 2017-07-14 NOTE — Patient Instructions (Signed)
I value your feedback and entrusting us with your care. If you get a [facility] patient survey, I would appreciate you taking the time to let us know about your experience today. Thank you! 

## 2017-07-14 NOTE — Addendum Note (Signed)
Addended by: Ardeth Perfect B on: 5/37/9432 09:29 AM   Modules accepted: Orders

## 2017-07-14 NOTE — Progress Notes (Addendum)
    Burnard Hawthorne, FNP   Chief Complaint  Patient presents with  . Follow-up    EMB    HPI:      Mia Oconnell is a 52 y.o. G0P0000 who LMP was Patient's last menstrual period was 06/20/2017., presents today for EMB due to DUB for the past 4-5 months. Had neg labs this month. U/S WNL except EM=7.46 mm. Suggested EMB.   Endometrial Biopsy After discussion with the patient regarding her abnormal uterine bleeding I recommended that she proceed with an endometrial biopsy for further diagnosis. The risks, benefits, alternatives, and indications for an endometrial biopsy were discussed with the patient in detail. She understood the risks including infection, bleeding, cervical laceration and uterine perforation.  Verbal consent was obtained.   PROCEDURE NOTE:  Pipelle endometrial biopsy was performed using aseptic technique with iodine preparation.  The uterus was sounded to a length of 9.0 cm.  Adequate sampling was obtained with minimal blood loss.  The patient tolerated the procedure well.  Disposition will be pending pathology.   Assessment/Plan: DUB (dysfunctional uterine bleeding) - EMB today. Will f/u with results. If neg, mgmt opions of BC, IUD, ablation, watch and wait discussed. Pt not really bothered by bleeding.  - Plan: Pathology    Return if symptoms worsen or fail to improve.  Marlene Beidler B. Ilani Otterson, PA-C 07/14/2017 9:28 AM

## 2017-07-18 LAB — PATHOLOGY

## 2017-07-27 ENCOUNTER — Telehealth: Payer: Self-pay

## 2017-07-27 NOTE — Telephone Encounter (Signed)
Patients colonoscopy has been rescheduled from May 20th due to Dr. Marius Ditch not scoping that day.  Moved to Monday June 10th.

## 2017-08-07 ENCOUNTER — Other Ambulatory Visit: Payer: Self-pay | Admitting: Obstetrics and Gynecology

## 2017-08-16 ENCOUNTER — Other Ambulatory Visit: Payer: Self-pay | Admitting: Obstetrics and Gynecology

## 2017-08-16 DIAGNOSIS — Z760 Encounter for issue of repeat prescription: Secondary | ICD-10-CM

## 2017-08-20 ENCOUNTER — Other Ambulatory Visit: Payer: Self-pay | Admitting: Obstetrics and Gynecology

## 2017-08-20 DIAGNOSIS — Z760 Encounter for issue of repeat prescription: Secondary | ICD-10-CM

## 2017-08-24 ENCOUNTER — Other Ambulatory Visit: Payer: Self-pay | Admitting: Obstetrics and Gynecology

## 2017-08-24 DIAGNOSIS — Z760 Encounter for issue of repeat prescription: Secondary | ICD-10-CM

## 2017-08-28 ENCOUNTER — Ambulatory Visit: Payer: Commercial Managed Care - PPO | Admitting: Anesthesiology

## 2017-08-28 ENCOUNTER — Encounter
Admission: RE | Disposition: A | Discharge status: Home / Self Care | Payer: Self-pay | Source: Ambulatory Visit | Attending: Gastroenterology

## 2017-08-28 ENCOUNTER — Ambulatory Visit
Admission: RE | Admit: 2017-08-28 | Discharge: 2017-08-28 | Disposition: A | Discharge status: Home / Self Care | Payer: Commercial Managed Care - PPO | Source: Ambulatory Visit | Attending: Gastroenterology | Admitting: Gastroenterology

## 2017-08-28 ENCOUNTER — Encounter: Payer: Self-pay | Admitting: *Deleted

## 2017-08-28 DIAGNOSIS — E559 Vitamin D deficiency, unspecified: Secondary | ICD-10-CM | POA: Insufficient documentation

## 2017-08-28 DIAGNOSIS — Z1211 Encounter for screening for malignant neoplasm of colon: Secondary | ICD-10-CM | POA: Diagnosis not present

## 2017-08-28 DIAGNOSIS — I10 Essential (primary) hypertension: Secondary | ICD-10-CM | POA: Insufficient documentation

## 2017-08-28 DIAGNOSIS — D120 Benign neoplasm of cecum: Secondary | ICD-10-CM | POA: Insufficient documentation

## 2017-08-28 DIAGNOSIS — E785 Hyperlipidemia, unspecified: Secondary | ICD-10-CM | POA: Diagnosis not present

## 2017-08-28 DIAGNOSIS — E119 Type 2 diabetes mellitus without complications: Secondary | ICD-10-CM | POA: Insufficient documentation

## 2017-08-28 DIAGNOSIS — F419 Anxiety disorder, unspecified: Secondary | ICD-10-CM | POA: Insufficient documentation

## 2017-08-28 DIAGNOSIS — Z7982 Long term (current) use of aspirin: Secondary | ICD-10-CM | POA: Insufficient documentation

## 2017-08-28 DIAGNOSIS — Z0000 Encounter for general adult medical examination without abnormal findings: Secondary | ICD-10-CM

## 2017-08-28 DIAGNOSIS — Z79899 Other long term (current) drug therapy: Secondary | ICD-10-CM | POA: Diagnosis not present

## 2017-08-28 DIAGNOSIS — Z299 Encounter for prophylactic measures, unspecified: Secondary | ICD-10-CM

## 2017-08-28 DIAGNOSIS — Z7984 Long term (current) use of oral hypoglycemic drugs: Secondary | ICD-10-CM | POA: Insufficient documentation

## 2017-08-28 HISTORY — PX: COLONOSCOPY WITH PROPOFOL: SHX5780

## 2017-08-28 LAB — GLUCOSE, CAPILLARY: Glucose-Capillary: 107 mg/dL — ABNORMAL HIGH (ref 65–99)

## 2017-08-28 LAB — POCT PREGNANCY, URINE: PREG TEST UR: NEGATIVE

## 2017-08-28 SURGERY — COLONOSCOPY WITH PROPOFOL
Anesthesia: General

## 2017-08-28 MED ORDER — PROPOFOL 10 MG/ML IV BOLUS
INTRAVENOUS | Status: DC | PRN
  Administered 2017-08-28: 80 mg via INTRAVENOUS

## 2017-08-28 MED ORDER — PROPOFOL 500 MG/50ML IV EMUL
INTRAVENOUS | Status: DC | PRN
  Administered 2017-08-28: 100 ug/kg/min via INTRAVENOUS

## 2017-08-28 MED ORDER — PROPOFOL 10 MG/ML IV BOLUS
INTRAVENOUS | Status: AC
  Filled 2017-08-28: qty 20

## 2017-08-28 MED ORDER — PROPOFOL 500 MG/50ML IV EMUL
INTRAVENOUS | Status: AC
  Filled 2017-08-28: qty 50

## 2017-08-28 MED ORDER — PHENYLEPHRINE HCL 10 MG/ML IJ SOLN
INTRAMUSCULAR | Status: AC
  Filled 2017-08-28: qty 1

## 2017-08-28 MED ORDER — SODIUM CHLORIDE 0.9 % IV SOLN
INTRAVENOUS | Status: DC
  Administered 2017-08-28: 1000 mL via INTRAVENOUS

## 2017-08-28 NOTE — Anesthesia Preprocedure Evaluation (Signed)
Anesthesia Evaluation  Patient identified by MRN, date of birth, ID band Patient awake    Reviewed: Allergy & Precautions, H&P , NPO status , Patient's Chart, lab work & pertinent test results, reviewed documented beta blocker date and time   History of Anesthesia Complications Negative for: history of anesthetic complications  Airway Mallampati: III  TM Distance: >3 FB Neck ROM: full    Dental  (+) Dental Advidsory Given, Teeth Intact   Pulmonary neg pulmonary ROS,           Cardiovascular Exercise Tolerance: Good hypertension, (-) angina(-) CAD, (-) Past MI, (-) Cardiac Stents and (-) CABG (-) dysrhythmias (-) Valvular Problems/Murmurs     Neuro/Psych PSYCHIATRIC DISORDERS Anxiety negative neurological ROS     GI/Hepatic negative GI ROS, Neg liver ROS,   Endo/Other  diabetes  Renal/GU negative Renal ROS  negative genitourinary   Musculoskeletal   Abdominal   Peds  Hematology negative hematology ROS (+)   Anesthesia Other Findings Past Medical History: No date: Allergy No date: Anxiety No date: Diabetes mellitus without complication ([facility]) No date: Hx of varicose vein stripping No date: Hyperlipidemia No date: Hypertension No date: Vitamin D deficiency   Reproductive/Obstetrics negative OB ROS                             Anesthesia Physical Anesthesia Plan  ASA: II  Anesthesia Plan: General   Post-op Pain Management:    Induction: Intravenous  PONV Risk Score and Plan: 3 and Propofol infusion  Airway Management Planned: Nasal Cannula  Additional Equipment:   Intra-op Plan:   Post-operative Plan:   Informed Consent: I have reviewed the patients History and Physical, chart, labs and discussed the procedure including the risks, benefits and alternatives for the proposed anesthesia with the patient or authorized representative who has indicated his/her understanding and  acceptance.   Dental Advisory Given  Plan Discussed with: Anesthesiologist, CRNA and Surgeon  Anesthesia Plan Comments:         Anesthesia Quick Evaluation

## 2017-08-28 NOTE — Anesthesia Post-op Follow-up Note (Signed)
Anesthesia QCDR form completed.        

## 2017-08-28 NOTE — H&P (Signed)
Cephas Darby, MD [address]  [place]  [place], [place] [address]  Main: [phone]  Fax: [phone] Pager: [phone]  Primary Care Physician:  Burnard Hawthorne, FNP Primary Gastroenterologist:  Dr. Cephas Darby  Pre-Procedure History & Physical: HPI:  Mia Oconnell is a 52 y.o. female is here for an colonoscopy.   Past Medical History:  Diagnosis Date  . Allergy   . Anxiety   . Diabetes mellitus without complication ([place])   . Hx of varicose vein stripping   . Hyperlipidemia   . Hypertension   . Vitamin D deficiency     Past Surgical History:  Procedure Laterality Date  . BREAST SURGERY    . SCLERAL BUCKLE    . VARICOSE VEIN SURGERY      Prior to Admission medications   Medication Sig Start Date End Date Taking? Authorizing Provider  atorvastatin (LIPITOR) 20 MG tablet Take 1 tablet (20 mg total) by mouth daily. 06/26/52  Yes Copland, Elmo Putt B, PA-C  loratadine (CLARITIN) 10 MG tablet Take 10 mg by mouth daily.   Yes [provider]  metFORMIN (GLUCOPHAGE-XR) 500 MG 24 hr tablet TAKE 1 TABLET EVERY DAY WITH THE EVENING MEAL 08/20/68  Yes Copland, Alicia B, PA-C  sertraline (ZOLOFT) 50 MG tablet Take 1 tablet (50 mg total) by mouth daily. 05/24/98  Yes Copland, Elmo Putt B, PA-C  amLODipine (NORVASC) 2.5 MG tablet TAKE 1 TABLET BY MOUTH EVERY DAY Patient not taking: Reported on 06/26/2017 12/19/16   Burnard Hawthorne, FNP  aspirin 81 MG tablet Take 81 mg by mouth daily.    [provider]  cholecalciferol (VITAMIN D) 1000 units tablet Take 8,000 Units by mouth once.    [provider]  Influenza vac split quadrivalent PF (FLUZONE QUADRIVALENT) 0.5 ML injection Fluzone Quad 2018-19(PF) 60 mcg(15 mcgx4)/0.5 mL intramuscular syringe  TO BE ADMINISTERED BY PHARMACIST FOR IMMUNIZATION    [provider]  Tdap (BOOSTRIX) 5-2.5-18.5 LF-MCG/0.5 injection Boostrix Tdap 2.5 Lf unit-8 mcg-5 Lf/0.5 mL intramuscular syringe  TO BE  ADMINISTERED BY PHARMACIST FOR IMMUNIZATION    [provider]    Allergies as of 06/29/2017 - Review Complete 06/26/2017  Allergen Reaction Noted  . Lisinopril  10/18/2016  . Penicillins  10/24/2016  . Sulfa antibiotics  10/24/2016    Family History  Problem Relation Age of Onset  . Hyperlipidemia Mother   . Hypertension Mother   . Basal cell carcinoma Mother 68  . Hyperlipidemia Father   . Hypertension Father   . Alcohol abuse Maternal Uncle   . Hyperlipidemia Maternal Grandmother   . Hypertension Maternal Grandmother   . Hyperlipidemia Maternal Grandfather   . Heart disease Maternal Grandfather   . Hypertension Maternal Grandfather   . Hyperlipidemia Paternal Grandmother   . Hypertension Paternal Grandmother   . Cancer Paternal Grandfather        colon  . Hyperlipidemia Paternal Grandfather   . Hypertension Paternal Grandfather     Social History   Socioeconomic History  . Marital status: Married    Spouse name: Not on file  . Number of children: Not on file  . Years of education: Not on file  . Highest education level: Not on file  Occupational History  . Not on file  Social Needs  . Financial resource strain: Not on file  . Food insecurity:    Worry: Not on file    Inability: Not on file  . Transportation needs:    Medical: Not  on file    Non-medical: Not on file  Tobacco Use  . Smoking status: Never Smoker  . Smokeless tobacco: Never Used  Substance and Sexual Activity  . Alcohol use: No  . Drug use: No  . Sexual activity: Yes    Birth control/protection: None, Condom  Lifestyle  . Physical activity:    Days per week: 0 days    Minutes per session: Not on file  . Stress: Not on file  Relationships  . Social connections:    Talks on phone: Not on file    Gets together: Not on file    Attends religious service: Not on file    Active member of club or organization: Not on file    Attends meetings of clubs or organizations: Not on file     Relationship status: Not on file  . Intimate partner violence:    Fear of current or ex partner: Not on file    Emotionally abused: Not on file    Physically abused: Not on file    Forced sexual activity: Not on file  Other Topics Concern  . 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 Neck:  Supple; no masses or thyromegaly. Lungs:  Clear throughout to auscultation.    Heart:  Regular rate and rhythm. Abdomen:  Soft, nontender and nondistended. Normal bowel sounds, without guarding, and without rebound.   Neurologic:  Alert and  oriented x4;  grossly normal neurologically.  Impression/Plan: Mia Oconnell is here for an colonoscopy to be performed for colon cancer screening  Risks, benefits, limitations, and alternatives regarding  colonoscopy have been reviewed with the patient.  Questions have been answered.  All parties agreeable.   Sherri Sear, MD  08/28/2017, 9:16 AM

## 2017-08-28 NOTE — Op Note (Signed)
[facility] Gastroenterology Patient Name: Mia Oconnell Procedure Date: 08/28/2017 7:40 AM MRN: [id number] Account #: [id number] Date of Birth: 15-Jul-1965 Admit Type: Outpatient Age: 52 Room: [facility] ENDO ROOM 2 Gender: Female Note Status: Finalized Procedure:            Colonoscopy Indications:          Screening for colorectal malignant neoplasm, This is                        the patient's first colonoscopy Providers:            Lin Landsman MD, MD Referring MD:         Yvetta Coder. Arnett (Referring MD) Medicines:            Monitored Anesthesia Care Complications:        No immediate complications. Estimated blood loss:                        Minimal. Procedure:            Pre-Anesthesia Assessment:                       - Prior to the procedure, a History and Physical was                        performed, and patient medications and allergies were                        reviewed. The patient is competent. The risks and                        benefits of the procedure and the sedation options and                        risks were discussed with the patient. All questions                        were answered and informed consent was obtained.                        Patient identification and proposed procedure were                        verified by the physician, the nurse, the                        anesthesiologist, the anesthetist and the technician in                        the pre-procedure area in the procedure room in the                        endoscopy suite. Mental Status Examination: alert and                        oriented. Airway Examination: normal oropharyngeal                        airway and neck mobility. Respiratory Examination:  clear to auscultation. CV Examination: normal.                        Prophylactic Antibiotics: The patient does not require                        prophylactic antibiotics. Prior  Anticoagulants: The                        patient has taken no previous anticoagulant or                        antiplatelet agents. ASA Grade Assessment: II - A                        patient with mild systemic disease. After reviewing the                        risks and benefits, the patient was deemed in                        satisfactory condition to undergo the procedure. The                        anesthesia plan was to use monitored anesthesia care                        (MAC). Immediately prior to administration of                        medications, the patient was re-assessed for adequacy                        to receive sedatives. The heart rate, respiratory rate,                        oxygen saturations, blood pressure, adequacy of                        pulmonary ventilation, and response to care were                        monitored throughout the procedure. The physical status                        of the patient was re-assessed after the procedure.                       After obtaining informed consent, the colonoscope was                        passed under direct vision. Throughout the procedure,                        the patient's blood pressure, pulse, and oxygen                        saturations were monitored continuously. The                        Colonoscope  was introduced through the anus and                        advanced to the the cecum, identified by appendiceal                        orifice and ileocecal valve. The colonoscopy was                        extremely difficult due to poor bowel prep, significant                        looping and the patient's body habitus. Successful                        completion of the procedure was aided by increasing the                        dose of sedation medication, changing the patient to a                        supine position, changing the patient to a prone                        position, applying  abdominal pressure and lavage. The                        patient tolerated the procedure well. The quality of                        the bowel preparation was evaluated using the BBPS                        [facility] Bowel Preparation Scale) with scores of: Right                        Colon = 3, Transverse Colon = 3 and Left Colon = 3                        (entire mucosa seen well with no residual staining,                        small fragments of stool or opaque liquid). The total                        BBPS score equals 9. Findings:      The perianal and digital rectal examinations were normal. Pertinent       negatives include normal sphincter tone and no palpable rectal lesions.      A 6 mm polyp was found in the cecum. The polyp was sessile. The polyp       was removed with a cold snare. Resection and retrieval were complete. To       prevent bleeding after the polypectomy, three hemostatic clips were       successfully placed (MR conditional). There was no bleeding at the end       of the procedure.      Copious quantities of liquid stool was found in the sigmoid colon and in  the descending colon, precluding visualization. Lavage of the area was       performed using greater than 500 mL of sterile water, resulting in       clearance with adequate visualization.      The retroflexed view of the distal rectum and anal verge was normal and       showed no anal or rectal abnormalities. Impression:           - One 6 mm polyp in the cecum, removed with a cold                        snare. Resected and retrieved. Clips (MR conditional)                        were placed.                       - Stool in the sigmoid colon and in the descending                        colon.                       - The distal rectum and anal verge are normal on                        retroflexion view. Recommendation:       - Discharge patient to home.                       - Resume previous diet  today.                       - Continue present medications.                       - Await pathology results.                       - Repeat colonoscopy in 5 years for surveillance. Procedure Code(s):    --- Professional ---                       [id number], Colonoscopy, flexible; with removal of tumor(s),                        polyp(s), or other lesion(s) by snare technique Diagnosis Code(s):    --- Professional ---                       Z12.11, Encounter for screening for malignant neoplasm                        of colon                       D12.0, Benign neoplasm of cecum CPT copyright 2017 American Medical Association. All rights reserved. The codes documented in this report are preliminary and upon coder review may  be revised to meet current compliance requirements. Dr. Ulyess Mort Lin Landsman MD, MD 08/28/2017 9:07:33 AM This report has been signed electronically. Number of Addenda: 0 Note Initiated On: 08/28/2017 7:40 AM Scope Withdrawal Time: 0 hours 17 minutes  34 seconds  Total Procedure Duration: 0 hours 43 minutes 11 seconds       [facility]

## 2017-08-28 NOTE — Anesthesia Postprocedure Evaluation (Signed)
Anesthesia Post Note  Patient: Mia Oconnell  Procedure(s) Performed: COLONOSCOPY WITH PROPOFOL (N/A )  Patient location during evaluation: Endoscopy Anesthesia Type: General Level of consciousness: awake and alert Pain management: pain level controlled Vital Signs Assessment: post-procedure vital signs reviewed and stable Respiratory status: spontaneous breathing, nonlabored ventilation, respiratory function stable and patient connected to nasal cannula oxygen Cardiovascular status: blood pressure returned to baseline and stable Postop Assessment: no apparent nausea or vomiting Anesthetic complications: no     Last Vitals:  Vitals:   08/28/17 0907 08/28/17 0940  BP: (!) 119/57 110/70  Pulse:  60  Resp: 18 16  Temp: (!) 36.1 C   SpO2: 100% 100%    Last Pain:  Vitals:   08/28/17 0940  TempSrc:   PainSc: 0-No pain                 Martha Clan

## 2017-08-28 NOTE — Transfer of Care (Signed)
Immediate Anesthesia Transfer of Care Note  Patient: Mia Oconnell  Procedure(s) Performed: COLONOSCOPY WITH PROPOFOL (N/A )  Patient Location: Endoscopy Unit  Anesthesia Type:General  Level of Consciousness: drowsy and patient cooperative  Airway & Oxygen Therapy: Patient Spontanous Breathing and Patient connected to nasal cannula oxygen  Post-op Assessment: Report given to RN and Post -op Vital signs reviewed and stable  Post vital signs: Reviewed and stable  Last Vitals:  Vitals Value Taken Time  BP 119/57 08/28/2017  9:08 AM  Temp 36.1 C 08/28/2017  9:07 AM  Pulse 70 08/28/2017  9:08 AM  Resp 17 08/28/2017  9:08 AM  SpO2 99 % 08/28/2017  9:08 AM  Vitals shown include unvalidated device data.  Last Pain:  Vitals:   08/28/17 0907  TempSrc: Tympanic  PainSc: 0-No pain         Complications: No apparent anesthesia complications

## 2017-08-29 ENCOUNTER — Encounter: Payer: Self-pay | Admitting: Gastroenterology

## 2017-08-29 LAB — SURGICAL PATHOLOGY

## 2017-09-01 ENCOUNTER — Other Ambulatory Visit: Payer: Self-pay | Admitting: Obstetrics and Gynecology

## 2017-09-01 DIAGNOSIS — Z760 Encounter for issue of repeat prescription: Secondary | ICD-10-CM

## 2017-09-01 NOTE — Telephone Encounter (Signed)
Please advise. I think you stated in your noteshe needed to f/u with primary

## 2017-09-04 ENCOUNTER — Other Ambulatory Visit: Payer: Self-pay | Admitting: Obstetrics and Gynecology

## 2017-09-04 DIAGNOSIS — Z760 Encounter for issue of repeat prescription: Secondary | ICD-10-CM

## 2017-09-04 NOTE — Telephone Encounter (Signed)
Please advise 

## 2017-09-06 ENCOUNTER — Other Ambulatory Visit: Payer: Self-pay | Admitting: Obstetrics and Gynecology

## 2017-09-06 MED ORDER — ATORVASTATIN CALCIUM 20 MG PO TABS: 20.0000 mg | ORAL_TABLET | Freq: Every day | ORAL | 1 refills | Status: DC

## 2017-09-06 MED ORDER — METFORMIN HCL ER 500 MG PO TB24: ORAL_TABLET | ORAL | 1 refills | Status: DC

## 2017-10-03 ENCOUNTER — Other Ambulatory Visit: Payer: Self-pay | Admitting: Obstetrics and Gynecology

## 2017-10-03 DIAGNOSIS — Z760 Encounter for issue of repeat prescription: Secondary | ICD-10-CM

## 2017-10-03 NOTE — Telephone Encounter (Signed)
Please advise 

## 2017-10-09 ENCOUNTER — Encounter: Payer: Commercial Managed Care - PPO | Admitting: Family

## 2017-11-01 ENCOUNTER — Ambulatory Visit (INDEPENDENT_AMBULATORY_CARE_PROVIDER_SITE_OTHER): Payer: Commercial Managed Care - PPO | Admitting: Family

## 2017-11-01 ENCOUNTER — Encounter: Payer: Self-pay | Admitting: Family

## 2017-11-01 VITALS — BP 130/84 | HR 72 | Temp 98.6°F | Resp 16 | Wt 249.0 lb

## 2017-11-01 DIAGNOSIS — Z283 Underimmunization status: Secondary | ICD-10-CM

## 2017-11-01 DIAGNOSIS — E118 Type 2 diabetes mellitus with unspecified complications: Secondary | ICD-10-CM | POA: Diagnosis not present

## 2017-11-01 DIAGNOSIS — Z2839 Other underimmunization status: Secondary | ICD-10-CM | POA: Insufficient documentation

## 2017-11-01 DIAGNOSIS — F411 Generalized anxiety disorder: Secondary | ICD-10-CM

## 2017-11-01 DIAGNOSIS — Z760 Encounter for issue of repeat prescription: Secondary | ICD-10-CM | POA: Diagnosis not present

## 2017-11-01 DIAGNOSIS — Z0000 Encounter for general adult medical examination without abnormal findings: Secondary | ICD-10-CM | POA: Diagnosis not present

## 2017-11-01 DIAGNOSIS — I10 Essential (primary) hypertension: Secondary | ICD-10-CM | POA: Diagnosis not present

## 2017-11-01 MED ORDER — ATORVASTATIN CALCIUM 20 MG PO TABS: 20.0000 mg | ORAL_TABLET | Freq: Every day | ORAL | 1 refills | Status: DC

## 2017-11-01 MED ORDER — METFORMIN HCL ER 500 MG PO TB24
ORAL_TABLET | ORAL | 1 refills | Status: DC
Start: 2017-11-01 — End: 2018-01-03

## 2017-11-01 NOTE — Assessment & Plan Note (Signed)
Pending A1c.  Compliant medication.

## 2017-11-01 NOTE — Assessment & Plan Note (Signed)
Diastolic blood pressure, ever so slightly elevated.  Patient will keep a log at home and will start amlodipine 2.5 mg if persistently elevated.

## 2017-11-01 NOTE — Assessment & Plan Note (Signed)
Doing well. Will continue current regimen.

## 2017-11-01 NOTE — Assessment & Plan Note (Signed)
Clinical breast exam performed today.  Patient is up-to-date on mammogram and Pap smear and follows OB/GYN for this.  Deferred pelvic exam in the absence of complaints.  Discussed weight with patient at length.  She will consider in the future referral to [facility] and also bariatric surgery.  She will let me know.

## 2017-11-01 NOTE — Assessment & Plan Note (Signed)
Series had been interrupted.  Per guidelines, this series may be interrupted and she may resume.  Hepatitis A will be given today.

## 2017-11-01 NOTE — Progress Notes (Signed)
Subjective:    Patient ID: Mia Oconnell, female    DOB: March 07, 1966, 52 y.o.   MRN: [id number]  CC: Mia Oconnell is a 52 y.o. female who presents today for physical exam.    HPI: HTN- checking blood pressure at home, getting 126/ 88, not taking amlodipine. Denies exertional chest pain or pressure, numbness or tingling radiating to left arm or jaw, palpitations, dizziness, frequent headaches, changes in vision, or shortness of breath.   Dm- doing well on metformin.   GAD- doing well zoloft. No hi/si  H/o vitamin D deficiency  Started hep A last year prior to [place]. Plans to go to [place] end of month. No h/o NAFLD.    Concerned with weight. Not excercising. plans to focus on herself now that last child has gone to college.       Colorectal Cancer Screening: UTD, repeat 5 years Breast Cancer Screening: Mammogram UTD, 06/2017.  Cervical Cancer Screening: Done 2019, normal per patient; follows with OB GYN.         Tetanus - utd        Pneumococcal - Candidate for, consents  HIV Screening- Candidate for, declines  Labs: Screening labs today. Exercise: Gets regular exercise.  Alcohol use: none Smoking/tobacco use: Nonsmoker.  Regular dental exams: UTD Wears seat belt: Yes. Skin: h/o basal cell; follow with dermatology HISTORY:  Past Medical History:  Diagnosis Date  . Allergy   . Anxiety   . Diabetes mellitus without complication ([place])   . Hx of varicose vein stripping   . Hyperlipidemia   . Hypertension   . Vitamin D deficiency     Past Surgical History:  Procedure Laterality Date  . BREAST SURGERY    . COLONOSCOPY WITH PROPOFOL N/A 08/28/2017   Procedure: COLONOSCOPY WITH PROPOFOL;  Surgeon: Lin Landsman, MD;  Location: [facility] ENDOSCOPY;  Service: Gastroenterology;  Laterality: N/A;  . SCLERAL BUCKLE    . VARICOSE VEIN SURGERY     Family History  Problem Relation Age of Onset  . Hyperlipidemia Mother   . Hypertension Mother   . Basal cell carcinoma  Mother 62  . Hyperlipidemia Father   . Hypertension Father   . Alcohol abuse Maternal Uncle   . Hyperlipidemia Maternal Grandmother   . Hypertension Maternal Grandmother   . Hyperlipidemia Maternal Grandfather   . Heart disease Maternal Grandfather   . Hypertension Maternal Grandfather   . Hyperlipidemia Paternal Grandmother   . Hypertension Paternal Grandmother   . Cancer Paternal Grandfather        colon  . Hyperlipidemia Paternal Grandfather   . Hypertension Paternal Grandfather       ALLERGIES: Lisinopril; Penicillins; and Sulfa antibiotics  Current Outpatient Medications on File Prior to Visit  Medication Sig Dispense Refill  . loratadine (CLARITIN) 10 MG tablet Take 10 mg by mouth daily.    . Multiple Vitamins-Minerals (CENTRUM SILVER 50+WOMEN) TABS     . sertraline (ZOLOFT) 50 MG tablet Take 1 tablet (50 mg total) by mouth daily. 90 tablet 3  . amLODipine (NORVASC) 2.5 MG tablet TAKE 1 TABLET BY MOUTH EVERY DAY (Patient not taking: Reported on 06/26/2017) 30 tablet 0  . aspirin 81 MG tablet Take 81 mg by mouth daily.    . Influenza vac split quadrivalent PF (FLUZONE QUADRIVALENT) 0.5 ML injection Fluzone Quad 2018-19(PF) 60 mcg(15 mcgx4)/0.5 mL intramuscular syringe  TO BE ADMINISTERED BY PHARMACIST FOR IMMUNIZATION    . Tdap (BOOSTRIX) 5-2.5-18.5 LF-MCG/0.5 injection Boostrix Tdap 2.5 Lf unit-8 mcg-5  Lf/0.5 mL intramuscular syringe  TO BE ADMINISTERED BY PHARMACIST FOR IMMUNIZATION     No current facility-administered medications on file prior to visit.     Social History   Tobacco Use  . Smoking status: Never Smoker  . Smokeless tobacco: Never Used  Substance Use Topics  . Alcohol use: No  . Drug use: No    Review of Systems  Constitutional: Negative for chills, fever and unexpected weight change.  HENT: Negative for congestion.   Respiratory: Negative for cough.   Cardiovascular: Negative for chest pain, palpitations and leg swelling.  Gastrointestinal:  Negative for nausea and vomiting.  Musculoskeletal: Negative for arthralgias and myalgias.  Skin: Negative for rash.  Neurological: Negative for headaches.  Hematological: Negative for adenopathy.  Psychiatric/Behavioral: Negative for confusion.      Objective:    BP 130/84 (BP Location: Left Arm, Patient Position: Sitting, Cuff Size: Large)   Pulse 72   Temp 98.6 F (37 C) (Oral)   Resp 16   Wt 249 lb (112.9 kg)   SpO2 98%   BMI 39.00 kg/m   BP Readings from Last 3 Encounters:  11/01/17 130/84  08/28/17 110/70  07/14/17 (!) 132/98   Wt Readings from Last 3 Encounters:  11/01/17 249 lb (112.9 kg)  08/28/17 244 lb (110.7 kg)  07/14/17 248 lb (112.5 kg)    Physical Exam  Constitutional: She appears well-developed and well-nourished.  Eyes: Conjunctivae are normal.  Neck: No thyroid mass and no thyromegaly present.  Cardiovascular: Normal rate, regular rhythm, normal heart sounds and normal pulses.  Pulmonary/Chest: Effort normal and breath sounds normal. She has no wheezes. She has no rhonchi. She has no rales. Right breast exhibits no inverted nipple, no mass, no nipple discharge, no skin change and no tenderness. Left breast exhibits no inverted nipple, no mass, no nipple discharge, no skin change and no tenderness. Breasts are symmetrical.  CBE performed.   Lymphadenopathy:       Head (right side): No submental, no submandibular, no tonsillar, no preauricular, no posterior auricular and no occipital adenopathy present.       Head (left side): No submental, no submandibular, no tonsillar, no preauricular, no posterior auricular and no occipital adenopathy present.    She has no cervical adenopathy.       Right cervical: No superficial cervical, no deep cervical and no posterior cervical adenopathy present.      Left cervical: No superficial cervical, no deep cervical and no posterior cervical adenopathy present.    She has no axillary adenopathy.  Neurological: She is  alert.  Skin: Skin is warm and dry.  Psychiatric: She has a normal mood and affect. Her speech is normal and behavior is normal. Thought content normal.  Vitals reviewed.      Assessment & Plan:   Problem List Items Addressed This Visit      Cardiovascular and Mediastinum   Essential hypertension    Diastolic blood pressure, ever so slightly elevated.  Patient will keep a log at home and will start amlodipine 2.5 mg if persistently elevated.      Relevant Medications   atorvastatin (LIPITOR) 20 MG tablet     Endocrine   DM (diabetes mellitus) ([place])    Pending A1c.  Compliant medication.      Relevant Medications   metFORMIN (GLUCOPHAGE-XR) 500 MG 24 hr tablet   atorvastatin (LIPITOR) 20 MG tablet     Other   GAD (generalized anxiety disorder)    Doing well.  Will continue current regimen.       Routine physical examination - Primary    Clinical breast exam performed today.  Patient is up-to-date on mammogram and Pap smear and follows OB/GYN for this.  Deferred pelvic exam in the absence of complaints.  Discussed weight with patient at length.  She will consider in the future referral to [facility] and also bariatric surgery.  She will let me know.      Relevant Orders   Comprehensive metabolic panel   Hemoglobin A1c   Microalbumin / creatinine urine ratio   Lipid panel   TSH   VITAMIN D 25 Hydroxy (Vit-D Deficiency, Fractures)   Hepatitis A vaccination not up to date    Series had been interrupted.  Per guidelines, this series may be interrupted and she may resume.  Hepatitis A will be given today.       Other Visit Diagnoses    Prescription refill       Rx RF metformin and atorvastatin until pt f/u with PCP for labs/mgmt 6/19   Relevant Medications   metFORMIN (GLUCOPHAGE-XR) 500 MG 24 hr tablet   atorvastatin (LIPITOR) 20 MG tablet       I have discontinued Farran Hulgan's cholecalciferol. I am also having her maintain her aspirin, amLODipine,  loratadine, sertraline, Tdap, Influenza vac split quadrivalent PF, CENTRUM SILVER 50+WOMEN, metFORMIN, and atorvastatin.   Meds ordered this encounter  Medications  . metFORMIN (GLUCOPHAGE-XR) 500 MG 24 hr tablet    Sig: TAKE 1 TABLET EVERY DAY WITH THE EVENING MEAL    Dispense:  30 tablet    Refill:  1  . atorvastatin (LIPITOR) 20 MG tablet    Sig: Take 1 tablet (20 mg total) by mouth daily.    Dispense:  90 tablet    Refill:  1    Order Specific Question:   Supervising Provider    Answer:   Crecencio Mc [[id number]]    Return precautions given.   Risks, benefits, and alternatives of the medications and treatment plan prescribed today were discussed, and patient expressed understanding.   Education regarding symptom management and diagnosis given to patient on AVS.   Continue to follow with Burnard Hawthorne, FNP for routine health maintenance.   Mia Oconnell and I agreed with plan.   Mable Paris, FNP

## 2017-11-01 NOTE — Patient Instructions (Addendum)
Monitor blood pressure,  Goal is less than 130/80; if persistently higher, please make sooner follow up appointment so we can recheck you blood pressure and manage medications Focus on bottom number of blood pressure, particularly , to ensure less than 80. You may need to take the amlodipine daily.   Fasting labs when you can.   Health Maintenance, Female Adopting a healthy lifestyle and getting preventive care can go a long way to promote health and wellness. Talk with your health care provider about what schedule of regular examinations is right for you. This is a good chance for you to check in with your provider about disease prevention and staying healthy. In between checkups, there are plenty of things you can do on your own. Experts have done a lot of research about which lifestyle changes and preventive measures are most likely to keep you healthy. Ask your health care provider for more information. Weight and diet Eat a healthy diet  Be sure to include plenty of vegetables, fruits, low-fat dairy products, and lean protein.  Do not eat a lot of foods high in solid fats, added sugars, or salt.  Get regular exercise. This is one of the most important things you can do for your health. ? Most adults should exercise for at least 150 minutes each week. The exercise should increase your heart rate and make you sweat (moderate-intensity exercise). ? Most adults should also do strengthening exercises at least twice a week. This is in addition to the moderate-intensity exercise.  Maintain a healthy weight  Body mass index (BMI) is a measurement that can be used to identify possible weight problems. It estimates body fat based on height and weight. Your health care provider can help determine your BMI and help you achieve or maintain a healthy weight.  For females 55 years of age and older: ? A BMI below 18.5 is considered underweight. ? A BMI of 18.5 to 24.9 is normal. ? A BMI of 25 to 29.9  is considered overweight. ? A BMI of 30 and above is considered obese.  Watch levels of cholesterol and blood lipids  You should start having your blood tested for lipids and cholesterol at 50 years of age, then have this test every 5 years.  You may need to have your cholesterol levels checked more often if: ? Your lipid or cholesterol levels are high. ? You are older than 50 years of age. ? You are at high risk for heart disease.  Cancer screening Lung Cancer  Lung cancer screening is recommended for adults 53-53 years old who are at high risk for lung cancer because of a history of smoking.  A yearly low-dose CT scan of the lungs is recommended for people who: ? Currently smoke. ? Have quit within the past 15 years. ? Have at least a 30-pack-year history of smoking. A pack year is smoking an average of one pack of cigarettes a day for 1 year.  Yearly screening should continue until it has been 15 years since you quit.  Yearly screening should stop if you develop a health problem that would prevent you from having lung cancer treatment.  Breast Cancer  Practice breast self-awareness. This means understanding how your breasts normally appear and feel.  It also means doing regular breast self-exams. Let your health care provider know about any changes, no matter how small.  If you are in your 20s or 30s, you should have a clinical breast exam (CBE) by a health  care provider every 1-3 years as part of a regular health exam.  If you are 93 or older, have a CBE every year. Also consider having a breast X-ray (mammogram) every year.  If you have a family history of breast cancer, talk to your health care provider about genetic screening.  If you are at high risk for breast cancer, talk to your health care provider about having an MRI and a mammogram every year.  Breast cancer gene (BRCA) assessment is recommended for women who have family members with BRCA-related cancers.  BRCA-related cancers include: ? Breast. ? Ovarian. ? Tubal. ? Peritoneal cancers.  Results of the assessment will determine the need for genetic counseling and BRCA1 and BRCA2 testing.  Cervical Cancer Your health care provider may recommend that you be screened regularly for cancer of the pelvic organs (ovaries, uterus, and vagina). This screening involves a pelvic examination, including checking for microscopic changes to the surface of your cervix (Pap test). You may be encouraged to have this screening done every 3 years, beginning at age 83.  For women ages 35-65, health care providers may recommend pelvic exams and Pap testing every 3 years, or they may recommend the Pap and pelvic exam, combined with testing for human papilloma virus (HPV), every 5 years. Some types of HPV increase your risk of cervical cancer. Testing for HPV may also be done on women of any age with unclear Pap test results.  Other health care providers may not recommend any screening for nonpregnant women who are considered low risk for pelvic cancer and who do not have symptoms. Ask your health care provider if a screening pelvic exam is right for you.  If you have had past treatment for cervical cancer or a condition that could lead to cancer, you need Pap tests and screening for cancer for at least 20 years after your treatment. If Pap tests have been discontinued, your risk factors (such as having a new sexual partner) need to be reassessed to determine if screening should resume. Some women have medical problems that increase the chance of getting cervical cancer. In these cases, your health care provider may recommend more frequent screening and Pap tests.  Colorectal Cancer  This type of cancer can be detected and often prevented.  Routine colorectal cancer screening usually begins at 50 years of age and continues through 50 years of age.  Your health care provider may recommend screening at an earlier age if  you have risk factors for colon cancer.  Your health care provider may also recommend using home test kits to check for hidden blood in the stool.  A small camera at the end of a tube can be used to examine your colon directly (sigmoidoscopy or colonoscopy). This is done to check for the earliest forms of colorectal cancer.  Routine screening usually begins at age 26.  Direct examination of the colon should be repeated every 5-10 years through 50 years of age. However, you may need to be screened more often if early forms of precancerous polyps or small growths are found.  Skin Cancer  Check your skin from head to toe regularly.  Tell your health care provider about any new moles or changes in moles, especially if there is a change in a mole's shape or color.  Also tell your health care provider if you have a mole that is larger than the size of a pencil eraser.  Always use sunscreen. Apply sunscreen liberally and repeatedly throughout the  day.  Protect yourself by wearing long sleeves, pants, a wide-brimmed hat, and sunglasses whenever you are outside.  Heart disease, diabetes, and high blood pressure  High blood pressure causes heart disease and increases the risk of stroke. High blood pressure is more likely to develop in: ? People who have blood pressure in the high end of the normal range (130-139/85-89 mm Hg). ? People who are overweight or obese. ? People who are African American.  If you are 47-77 years of age, have your blood pressure checked every 3-5 years. If you are 6 years of age or older, have your blood pressure checked every year. You should have your blood pressure measured twice-once when you are at a hospital or clinic, and once when you are not at a hospital or clinic. Record the average of the two measurements. To check your blood pressure when you are not at a hospital or clinic, you can use: ? An automated blood pressure machine at a pharmacy. ? A home blood  pressure monitor.  If you are between 38 years and 43 years old, ask your health care provider if you should take aspirin to prevent strokes.  Have regular diabetes screenings. This involves taking a blood sample to check your fasting blood sugar level. ? If you are at a normal weight and have a low risk for diabetes, have this test once every three years after 50 years of age. ? If you are overweight and have a high risk for diabetes, consider being tested at a younger age or more often. Preventing infection Hepatitis B  If you have a higher risk for hepatitis B, you should be screened for this virus. You are considered at high risk for hepatitis B if: ? You were born in a country where hepatitis B is common. Ask your health care provider which countries are considered high risk. ? Your parents were born in a high-risk country, and you have not been immunized against hepatitis B (hepatitis B vaccine). ? You have HIV or AIDS. ? You use needles to inject street drugs. ? You live with someone who has hepatitis B. ? You have had sex with someone who has hepatitis B. ? You get hemodialysis treatment. ? You take certain medicines for conditions, including cancer, organ transplantation, and autoimmune conditions.  Hepatitis C  Blood testing is recommended for: ? Everyone born from [birth year] through 1963. ? Anyone with known risk factors for hepatitis C.  Sexually transmitted infections (STIs)  You should be screened for sexually transmitted infections (STIs) including gonorrhea and chlamydia if: ? You are sexually active and are younger than 50 years of age. ? You are older than 50 years of age and your health care provider tells you that you are at risk for this type of infection. ? Your sexual activity has changed since you were last screened and you are at an increased risk for chlamydia or gonorrhea. Ask your health care provider if you are at risk.  If you do not have HIV, but are at risk,  it may be recommended that you take a prescription medicine daily to prevent HIV infection. This is called pre-exposure prophylaxis (PrEP). You are considered at risk if: ? You are sexually active and do not regularly use condoms or know the HIV status of your partner(s). ? You take drugs by injection. ? You are sexually active with a partner who has HIV.  Talk with your health care provider about whether you are at high  risk of being infected with HIV. If you choose to begin PrEP, you should first be tested for HIV. You should then be tested every 3 months for as long as you are taking PrEP. Pregnancy  If you are premenopausal and you may become pregnant, ask your health care provider about preconception counseling.  If you may become pregnant, take 400 to 800 micrograms (mcg) of folic acid every day.  If you want to prevent pregnancy, talk to your health care provider about birth control (contraception). Osteoporosis and menopause  Osteoporosis is a disease in which the bones lose minerals and strength with aging. This can result in serious bone fractures. Your risk for osteoporosis can be identified using a bone density scan.  If you are 46 years of age or older, or if you are at risk for osteoporosis and fractures, ask your health care provider if you should be screened.  Ask your health care provider whether you should take a calcium or vitamin D supplement to lower your risk for osteoporosis.  Menopause may have certain physical symptoms and risks.  Hormone replacement therapy may reduce some of these symptoms and risks. Talk to your health care provider about whether hormone replacement therapy is right for you. Follow these instructions at home:  Schedule regular health, dental, and eye exams.  Stay current with your immunizations.  Do not use any tobacco products including cigarettes, chewing tobacco, or electronic cigarettes.  If you are pregnant, do not drink  alcohol.  If you are breastfeeding, limit how much and how often you drink alcohol.  Limit alcohol intake to no more than 1 drink per day for nonpregnant women. One drink equals 12 ounces of beer, 5 ounces of wine, or 1 ounces of hard liquor.  Do not use street drugs.  Do not share needles.  Ask your health care provider for help if you need support or information about quitting drugs.  Tell your health care provider if you often feel depressed.  Tell your health care provider if you have ever been abused or do not feel safe at home. This information is not intended to replace advice given to you by your health care provider. Make sure you discuss any questions you have with your health care provider. Document Released: 09/20/2010 Document Revised: 08/13/2015 Document Reviewed: 12/09/2014 Elsevier Interactive Patient Education  [employer].

## 2017-11-02 ENCOUNTER — Other Ambulatory Visit: Payer: Self-pay | Admitting: Obstetrics and Gynecology

## 2017-11-02 DIAGNOSIS — Z760 Encounter for issue of repeat prescription: Secondary | ICD-10-CM

## 2017-11-09 ENCOUNTER — Other Ambulatory Visit (INDEPENDENT_AMBULATORY_CARE_PROVIDER_SITE_OTHER): Payer: Commercial Managed Care - PPO

## 2017-11-09 DIAGNOSIS — Z0000 Encounter for general adult medical examination without abnormal findings: Secondary | ICD-10-CM | POA: Diagnosis not present

## 2017-11-09 LAB — COMPREHENSIVE METABOLIC PANEL
ALBUMIN: 4.4 g/dL (ref 3.5–5.2)
ALK PHOS: 61 U/L (ref 39–117)
ALT: 25 U/L (ref 0–35)
AST: 20 U/L (ref 0–37)
BUN: 14 mg/dL (ref 6–23)
CALCIUM: 9.9 mg/dL (ref 8.4–10.5)
CO2: 29 mEq/L (ref 19–32)
CREATININE: 0.71 mg/dL (ref 0.40–1.20)
Chloride: 104 mEq/L (ref 96–112)
GFR: 92.43 mL/min (ref >60.00)
Glucose, Bld: 130 mg/dL — ABNORMAL HIGH (ref 70–99)
Potassium: 4.2 mEq/L (ref 3.5–5.1)
SODIUM: 140 meq/L (ref 135–145)
Total Bilirubin: 0.6 mg/dL (ref 0.2–1.2)
Total Protein: 7.2 g/dL (ref 6.0–8.3)

## 2017-11-09 LAB — LIPID PANEL
CHOLESTEROL: 153 mg/dL (ref 0–200)
HDL: 61.8 mg/dL (ref >39.00)
LDL CALC: 72 mg/dL (ref 0–99)
NonHDL: 91.01
Total CHOL/HDL Ratio: 2
Triglycerides: 95 mg/dL (ref 0.0–149.0)
VLDL: 19 mg/dL (ref 0.0–40.0)

## 2017-11-09 LAB — TSH: TSH: 2.38 u[IU]/mL (ref 0.35–4.50)

## 2017-11-09 LAB — MICROALBUMIN / CREATININE URINE RATIO
Creatinine,U: 226.8 mg/dL
MICROALB UR: 2.1 mg/dL — AB (ref 0.0–1.9)
Microalb Creat Ratio: 0.9 mg/g (ref 0.0–30.0)

## 2017-11-09 LAB — HEMOGLOBIN A1C: HEMOGLOBIN A1C: 6.3 % (ref 4.6–6.5)

## 2017-11-09 LAB — VITAMIN D 25 HYDROXY (VIT D DEFICIENCY, FRACTURES): VITD: 28.15 ng/mL — ABNORMAL LOW (ref 30.00–100.00)

## 2017-11-11 ENCOUNTER — Encounter: Payer: Self-pay | Admitting: Family

## 2017-11-13 ENCOUNTER — Other Ambulatory Visit: Payer: Self-pay | Admitting: Family

## 2017-11-13 DIAGNOSIS — T753XXA Motion sickness, initial encounter: Secondary | ICD-10-CM

## 2017-11-13 MED ORDER — SCOPOLAMINE 1 MG/3DAYS TD PT72: 1.0000 | MEDICATED_PATCH | TRANSDERMAL | 0 refills | Status: DC

## 2017-11-13 NOTE — Progress Notes (Signed)
close

## 2018-01-01 ENCOUNTER — Ambulatory Visit (INDEPENDENT_AMBULATORY_CARE_PROVIDER_SITE_OTHER): Payer: Commercial Managed Care - PPO | Admitting: Vascular Surgery

## 2018-01-01 ENCOUNTER — Encounter (INDEPENDENT_AMBULATORY_CARE_PROVIDER_SITE_OTHER): Payer: Self-pay | Admitting: Vascular Surgery

## 2018-01-01 VITALS — BP 166/93 | HR 88 | Resp 17 | Ht 67.0 in | Wt 249.0 lb

## 2018-01-01 DIAGNOSIS — I83813 Varicose veins of bilateral lower extremities with pain: Secondary | ICD-10-CM | POA: Diagnosis not present

## 2018-01-01 DIAGNOSIS — E1122 Type 2 diabetes mellitus with diabetic chronic kidney disease: Secondary | ICD-10-CM

## 2018-01-01 DIAGNOSIS — I129 Hypertensive chronic kidney disease with stage 1 through stage 4 chronic kidney disease, or unspecified chronic kidney disease: Secondary | ICD-10-CM

## 2018-01-01 DIAGNOSIS — I872 Venous insufficiency (chronic) (peripheral): Secondary | ICD-10-CM | POA: Diagnosis not present

## 2018-01-01 DIAGNOSIS — I10 Essential (primary) hypertension: Secondary | ICD-10-CM

## 2018-01-01 DIAGNOSIS — N182 Chronic kidney disease, stage 2 (mild): Secondary | ICD-10-CM

## 2018-01-02 ENCOUNTER — Encounter: Payer: Self-pay | Admitting: Family

## 2018-01-03 ENCOUNTER — Other Ambulatory Visit: Payer: Self-pay | Admitting: Family

## 2018-01-03 DIAGNOSIS — Z760 Encounter for issue of repeat prescription: Secondary | ICD-10-CM

## 2018-01-03 MED ORDER — METFORMIN HCL ER 500 MG PO TB24
ORAL_TABLET | ORAL | 3 refills | Status: DC
Start: 2018-01-03 — End: 2018-12-03

## 2018-01-03 MED ORDER — AMLODIPINE BESYLATE 2.5 MG PO TABS: 2.5000 mg | ORAL_TABLET | Freq: Every day | ORAL | 3 refills | Status: DC

## 2018-01-07 ENCOUNTER — Encounter (INDEPENDENT_AMBULATORY_CARE_PROVIDER_SITE_OTHER): Payer: Self-pay | Admitting: Vascular Surgery

## 2018-01-07 NOTE — Progress Notes (Signed)
MRN : [id number]  Mia Oconnell is a 52 y.o. (04-14-1965) female who presents with chief complaint of  Chief Complaint  Patient presents with  . Follow-up    right leg swelling  .  History of Present Illness:   Patient is seen for evaluation of right leg pain and right leg swelling. The patient first noticed the swelling remotely. The swelling is associated with pain and discoloration. The pain and swelling worsens with prolonged dependency and improves with elevation. The pain is unrelated to activity.  The patient notes that in the morning the legs are significantly improved but they steadily worsened throughout the course of the day. The patient also notes a steady worsening of the discoloration in the ankle and shin area.   The patient denies claudication symptoms.  The patient denies symptoms consistent with rest pain.  The patient denies and extensive history of DJD and LS spine disease.  The patient has no had any past angiography, interventions or vascular surgery.  Elevation makes the leg symptoms better, dependency makes them much worse. There is no history of ulcerations. The patient denies any recent changes in medications.  The patient has not been wearing graduated compression.  The patient denies a history of DVT or PE. There is no prior history of phlebitis. There is no history of primary lymphedema.  No history of malignancies. No history of trauma or groin or pelvic surgery. There is no history of radiation treatment to the groin or pelvis  The patient denies amaurosis fugax or recent TIA symptoms. There are no recent neurological changes noted. The patient denies recent episodes of angina or shortness of breath  Current Meds  Medication Sig  . atorvastatin (LIPITOR) 20 MG tablet Take 1 tablet (20 mg total) by mouth daily.  Marland Kitchen loratadine (CLARITIN) 10 MG tablet Take 10 mg by mouth daily.  . Multiple Vitamins-Minerals (CENTRUM SILVER 50+WOMEN) TABS   .  sertraline (ZOLOFT) 50 MG tablet Take 1 tablet (50 mg total) by mouth daily.  . [DISCONTINUED] metFORMIN (GLUCOPHAGE-XR) 500 MG 24 hr tablet TAKE 1 TABLET EVERY DAY WITH THE EVENING MEAL    Past Medical History:  Diagnosis Date  . Allergy   . Anxiety   . Diabetes mellitus without complication ([place])   . Hx of varicose vein stripping   . Hyperlipidemia   . Hypertension   . Vitamin D deficiency     Past Surgical History:  Procedure Laterality Date  . BREAST SURGERY    . COLONOSCOPY WITH PROPOFOL N/A 08/28/2017   Procedure: COLONOSCOPY WITH PROPOFOL;  Surgeon: Lin Landsman, MD;  Location: [facility] ENDOSCOPY;  Service: Gastroenterology;  Laterality: N/A;  . SCLERAL BUCKLE    . VARICOSE VEIN SURGERY      Social History Social History   Tobacco Use  . Smoking status: Never Smoker  . Smokeless tobacco: Never Used  Substance Use Topics  . Alcohol use: No  . Drug use: No    Family History Family History  Problem Relation Age of Onset  . Hyperlipidemia Mother   . Hypertension Mother   . Basal cell carcinoma Mother 30  . Hyperlipidemia Father   . Hypertension Father   . Alcohol abuse Maternal Uncle   . Hyperlipidemia Maternal Grandmother   . Hypertension Maternal Grandmother   . Hyperlipidemia Maternal Grandfather   . Heart disease Maternal Grandfather   . Hypertension Maternal Grandfather   . Hyperlipidemia Paternal Grandmother   . Hypertension Paternal Grandmother   . Cancer  Paternal Grandfather        colon  . Hyperlipidemia Paternal Grandfather   . Hypertension Paternal Grandfather     Allergies  Allergen Reactions  . Lisinopril     cough  . Penicillins   . Sulfa Antibiotics      REVIEW OF SYSTEMS (Negative unless checked)  Constitutional: [] Weight loss  [] Fever  [] Chills Cardiac: [] Chest pain   [] Chest pressure   [] Palpitations   [] Shortness of breath when laying flat   [] Shortness of breath with exertion. Vascular:  [] Pain in legs with walking    [x] Pain in legs at rest  [] History of DVT   [] Phlebitis   [x] Swelling in legs   [x] Varicose veins   [] Non-healing ulcers Pulmonary:   [] Uses home oxygen   [] Productive cough   [] Hemoptysis   [] Wheeze  [] COPD   [] Asthma Neurologic:  [] Dizziness   [] Seizures   [] History of stroke   [] History of TIA  [] Aphasia   [] Vissual changes   [] Weakness or numbness in arm   [] Weakness or numbness in leg Musculoskeletal:   [] Joint swelling   [] Joint pain   [] Low back pain Hematologic:  [] Easy bruising  [] Easy bleeding   [] Hypercoagulable state   [] Anemic Gastrointestinal:  [] Diarrhea   [] Vomiting  [] Gastroesophageal reflux/heartburn   [] Difficulty swallowing. Genitourinary:  [] Chronic kidney disease   [] Difficult urination  [] Frequent urination   [] Blood in urine Skin:  [] Rashes   [] Ulcers  Psychological:  [] History of anxiety   []  History of major depression.  Physical Examination  Vitals:   01/01/18 1542  BP: (!) 166/93  Pulse: 88  Resp: 17  Weight: 249 lb (112.9 kg)  Height: 5\' 7"  (1.702 m)   Body mass index is 39 kg/m. Gen: WD/WN, NAD Head: [place]/AT, No temporalis wasting.  Ear/Nose/Throat: Hearing grossly intact, nares w/o erythema or drainage Eyes: PER, EOMI, sclera nonicteric.  Neck: Supple, no large masses.   Pulmonary:  Good air movement, no audible wheezing bilaterally, no use of accessory muscles.  Cardiac: RRR, no JVD Vascular: Large varicosities present extensively greater than 10 mm right.  Mild to moderate venous stasis changes to the legs bilaterally.  2-3+ soft pitting edema right Vessel Right Left  Radial Palpable Palpable  PT Palpable Palpable  DP Palpable Palpable  Gastrointestinal: Non-distended. No guarding/no peritoneal signs.  Musculoskeletal: M/S 5/5 throughout.  No deformity or atrophy.  Neurologic: CN 2-12 intact. Symmetrical.  Speech is fluent. Motor exam as listed above. Psychiatric: Judgment intact, Mood & affect appropriate for pt's clinical situation. Dermatologic:  Venous rashes no ulcers noted.  No changes consistent with cellulitis. Lymph : No lichenification or skin changes of chronic lymphedema.  CBC No results found for: WBC, HGB, HCT, MCV, PLT  BMET    Component Value Date/Time   NA 140 11/09/2017 0911   K 4.2 11/09/2017 0911   CL 104 11/09/2017 0911   CO2 29 11/09/2017 0911   GLUCOSE 130 (H) 11/09/2017 0911   BUN 14 11/09/2017 0911   CREATININE 0.71 11/09/2017 0911   CALCIUM 9.9 11/09/2017 0911   CrCl cannot be calculated (Patient's most recent lab result is older than the maximum 21 days allowed.).  COAG No results found for: INR, PROTIME  Radiology No results found.   Assessment/Plan 1. Varicose veins of both lower extremities with pain No surgery or intervention at this point in time.    I have had a long discussion with the patient regarding venous insufficiency and why it  causes symptoms. I have discussed  with the patient the chronic skin changes that accompany venous insufficiency and the long term sequela such as infection and ulceration.  Patient will begin wearing graduated compression stockings class 1 (20-30 mmHg) or compression wraps on a daily basis a prescription was given. The patient will put the stockings on first thing in the morning and removing them in the evening. The patient is instructed specifically not to sleep in the stockings.    In addition, behavioral modification including several periods of elevation of the lower extremities during the day will be continued. I have demonstrated that proper elevation is a position with the ankles at heart level.  The patient is instructed to begin routine exercise, especially walking on a daily basis  Patient should undergo duplex ultrasound of the venous system to ensure that DVT or reflux is not present.  Following the review of the ultrasound the patient will follow up in 2-3 months to reassess the degree of swelling and the control that graduated compression  stockings or compression wraps  is offering.   The patient can be assessed for a Lymph Pump at that time  - VAS [place] LOWER EXTREMITY VENOUS REFLUX; Future  2. Chronic venous insufficiency No surgery or intervention at this point in time.    I have had a long discussion with the patient regarding venous insufficiency and why it  causes symptoms. I have discussed with the patient the chronic skin changes that accompany venous insufficiency and the long term sequela such as infection and ulceration.  Patient will begin wearing graduated compression stockings class 1 (20-30 mmHg) or compression wraps on a daily basis a prescription was given. The patient will put the stockings on first thing in the morning and removing them in the evening. The patient is instructed specifically not to sleep in the stockings.    In addition, behavioral modification including several periods of elevation of the lower extremities during the day will be continued. I have demonstrated that proper elevation is a position with the ankles at heart level.  The patient is instructed to begin routine exercise, especially walking on a daily basis  Patient should undergo duplex ultrasound of the venous system to ensure that DVT or reflux is not present.  Following the review of the ultrasound the patient will follow up in 2-3 months to reassess the degree of swelling and the control that graduated compression stockings or compression wraps  is offering.   The patient can be assessed for a Lymph Pump at that time  3. Essential hypertension Continue antihypertensive medications as already ordered, these medications have been reviewed and there are no changes at this time.   4. Type 2 diabetes mellitus with stage 2 chronic kidney disease, without long-term current use of insulin (HCC) Continue hypoglycemic medications as already ordered, these medications have been reviewed and there are no changes at this time.  Hgb A1C to be  monitored as already arranged by primary service     Hortencia Pilar, MD  01/07/2018 2:13 PM

## 2018-01-08 ENCOUNTER — Ambulatory Visit (INDEPENDENT_AMBULATORY_CARE_PROVIDER_SITE_OTHER): Payer: Commercial Managed Care - PPO | Admitting: Nurse Practitioner

## 2018-01-08 ENCOUNTER — Encounter (INDEPENDENT_AMBULATORY_CARE_PROVIDER_SITE_OTHER): Payer: Self-pay | Admitting: Nurse Practitioner

## 2018-01-08 ENCOUNTER — Ambulatory Visit (INDEPENDENT_AMBULATORY_CARE_PROVIDER_SITE_OTHER): Payer: Commercial Managed Care - PPO

## 2018-01-08 VITALS — BP 134/78 | HR 68 | Resp 16 | Ht 67.0 in | Wt 249.0 lb

## 2018-01-08 DIAGNOSIS — I83813 Varicose veins of bilateral lower extremities with pain: Secondary | ICD-10-CM

## 2018-01-08 DIAGNOSIS — I10 Essential (primary) hypertension: Secondary | ICD-10-CM | POA: Diagnosis not present

## 2018-01-08 DIAGNOSIS — E782 Mixed hyperlipidemia: Secondary | ICD-10-CM

## 2018-01-11 ENCOUNTER — Encounter (INDEPENDENT_AMBULATORY_CARE_PROVIDER_SITE_OTHER): Payer: Self-pay | Admitting: Nurse Practitioner

## 2018-01-11 NOTE — Progress Notes (Signed)
Subjective:    Patient ID: Mia Oconnell, female    DOB: [birth year], 52 y.o.   MRN: [id number] Chief Complaint  Patient presents with  . Follow-up    right le venous reflux    HPI  Brieanna Nau is a 52 y.o. female that follows up for pain and varicose veins. She has a history of endovenous laser ablation (not performed by this office)  The patient note significant improvement in the lower extremity pain but not resolution of the symptoms. The patient notes multiple residual varicosities bilaterally which continued to hurt with dependent positions and remained tender to palpation   . The patient continues to wear graduated compression stockings on a daily basis but these are not eliminating the pain and discomfort. The patient continues to use over-the-counter anti-inflammatory medications to treat the pain and related symptoms but this has not given the patient relief. The patient notes the pain in the lower extremities is causing problems with daily exercise, problems at work and even with household activities such as preparing meals and doing dishes.  The patient is otherwise done well and there have been no complications related to the laser procedure or interval changes in the patient's overall   A lower extremity venous reflux study shows evidence of prior laser surgery to the great saphenous vein.  Patient currently still has reflux in the great saphenous vein from the saphenofemoral junction to the level of the mid thigh. She has Reflux in the CFV as well. No DVT or SVT were present.     Past Medical History:  Diagnosis Date  . Allergy   . Anxiety   . Diabetes mellitus without complication ([place])   . Hx of varicose vein stripping   . Hyperlipidemia   . Hypertension   . Vitamin D deficiency     Past Surgical History:  Procedure Laterality Date  . BREAST SURGERY    . COLONOSCOPY WITH PROPOFOL N/A 08/28/2017   Procedure: COLONOSCOPY WITH PROPOFOL;  Surgeon: Lin Landsman, MD;  Location: [facility] ENDOSCOPY;  Service: Gastroenterology;  Laterality: N/A;  . SCLERAL BUCKLE    . VARICOSE VEIN SURGERY      Social History   Socioeconomic History  . Marital status: Married    Spouse name: Not on file  . Number of children: Not on file  . Years of education: Not on file  . Highest education level: Not on file  Occupational History  . Not on file  Social Needs  . Financial resource strain: Not on file  . Food insecurity:    Worry: Not on file    Inability: Not on file  . Transportation needs:    Medical: Not on file    Non-medical: Not on file  Tobacco Use  . Smoking status: Never Smoker  . Smokeless tobacco: Never Used  Substance and Sexual Activity  . Alcohol use: No  . Drug use: No  . Sexual activity: Yes    Birth control/protection: None, Condom  Lifestyle  . Physical activity:    Days per week: 0 days    Minutes per session: Not on file  . Stress: Not on file  Relationships  . Social connections:    Talks on phone: Not on file    Gets together: Not on file    Attends religious service: Not on file    Active member of club or organization: Not on file    Attends meetings of clubs or organizations: Not on file  Relationship status: Not on file  . Intimate partner violence:    Fear of current or ex partner: Not on file    Emotionally abused: Not on file    Physically abused: Not on file    Forced sexual activity: Not on file  Other Topics Concern  . 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 Hyperlipidemia Mother   . Hypertension Mother   . Basal cell carcinoma Mother 30  . Hyperlipidemia Father   . Hypertension Father   . Alcohol abuse Maternal Uncle   . Hyperlipidemia Maternal Grandmother   . Hypertension Maternal Grandmother   . Hyperlipidemia Maternal Grandfather   . Heart disease Maternal Grandfather   . Hypertension  Maternal Grandfather   . Hyperlipidemia Paternal Grandmother   . Hypertension Paternal Grandmother   . Cancer Paternal Grandfather        colon  . Hyperlipidemia Paternal Grandfather   . Hypertension Paternal Grandfather     Allergies  Allergen Reactions  . Lisinopril     cough  . Penicillins   . Sulfa Antibiotics      Review of Systems   Review of Systems: Negative Unless Checked Constitutional: [] Weight loss  [] Fever  [] Chills Cardiac: [] Chest pain   []  Atrial Fibrillation  [] Palpitations   [] Shortness of breath when laying flat   [] Shortness of breath with exertion. Vascular:  [] Pain in legs with walking   [x] Pain in legs with standing  [] History of DVT   [] Phlebitis   [] Swelling in legs   [x] Varicose veins   [] Non-healing ulcers Pulmonary:   [] Uses home oxygen   [] Productive cough   [] Hemoptysis   [] Wheeze  [] COPD   [] Asthma Neurologic:  [] Dizziness   [] Seizures   [] History of stroke   [] History of TIA  [] Aphasia   [] Vissual changes   [] Weakness or numbness in arm   [] Weakness or numbness in leg Musculoskeletal:   [] Joint swelling   [] Joint pain   [] Low back pain  []  History of Knee Replacement Hematologic:  [] Easy bruising  [] Easy bleeding   [] Hypercoagulable state   [] Anemic Gastrointestinal:  [] Diarrhea   [] Vomiting  [] Gastroesophageal reflux/heartburn   [] Difficulty swallowing. Genitourinary:  [] Chronic kidney disease   [] Difficult urination  [] Anuric   [] Blood in urine Skin:  [] Rashes   [] Ulcers  Psychological:  [] History of anxiety   []  History of major depression  []  Memory Difficulties     Objective:   Physical Exam  BP 134/78 (BP Location: Right Arm)   Pulse 68   Resp 16   Ht 5\' 7"  (1.702 m)   Wt 249 lb (112.9 kg)   BMI 39.00 kg/m   Gen: WD/WN, NAD Head: [place]/AT, No temporalis wasting.  Ear/Nose/Throat: Hearing grossly intact, nares w/o erythema or drainage Eyes: PER, EOMI, sclera nonicteric.  Neck: Supple, no masses.  No JVD.  Pulmonary:  Good air  movement, no use of accessory muscles.  Cardiac: RRR Vascular: Medium sized varicosities 2-3 cm present on right lower extremity, across knee, down calf, and medial thigh. Vessel Right Left  Radial Palpable Palpable  Dorsalis Pedis Palpable Palpable  Posterior Tibial Palpable Palpable   Gastrointestinal: soft, non-distended. No guarding/no peritoneal signs.  Musculoskeletal: M/S 5/5 throughout.  No deformity or atrophy.  Neurologic: Pain and light touch intact in extremities.  Symmetrical.  Speech is fluent. Motor exam as listed above. Psychiatric:  Judgment intact, Mood & affect appropriate for pt's clinical situation. Dermatologic: No Venous rashes. No Ulcers Noted.  No changes consistent with cellulitis. Lymph : No Cervical lymphadenopathy, no lichenification or skin changes of chronic lymphedema.      Assessment & Plan:   1. Varicose veins of both lower extremities with pain Recommend:  The patient has had successful ablation of the previously incompetent saphenous venous system but still has persistent symptoms of pain and swelling that are having a negative impact on daily life and daily activities.  Patient should undergo injection sclerotherapy to treat the residual varicosities.  The risks, benefits and alternative therapies were reviewed in detail with the patient.  All questions were answered.  The patient agrees to proceed with sclerotherapy at their convenience.  The patient will continue wearing the graduated compression stockings and using the over-the-counter pain medications to treat her symptoms.       2. Mixed hyperlipidemia Continue statin as ordered and reviewed, no changes at this time    3. Essential hypertension Continue antihypertensive medications as already ordered, these medications have been reviewed and there are no changes at this time.    Current Outpatient Medications on File Prior to Visit  Medication Sig Dispense Refill  . amLODipine  (NORVASC) 2.5 MG tablet Take 1 tablet (2.5 mg total) by mouth daily. 90 tablet 3  . aspirin 81 MG tablet Take 81 mg by mouth daily.    Marland Kitchen atorvastatin (LIPITOR) 20 MG tablet Take 1 tablet (20 mg total) by mouth daily. 90 tablet 1  . Influenza vac split quadrivalent PF (FLUZONE QUADRIVALENT) 0.5 ML injection Fluzone Quad 2018-19(PF) 60 mcg(15 mcgx4)/0.5 mL intramuscular syringe  TO BE ADMINISTERED BY PHARMACIST FOR IMMUNIZATION    . loratadine (CLARITIN) 10 MG tablet Take 10 mg by mouth daily.    . metFORMIN (GLUCOPHAGE-XR) 500 MG 24 hr tablet TAKE 1 TABLET EVERY DAY WITH THE EVENING MEAL 90 tablet 3  . Multiple Vitamins-Minerals (CENTRUM SILVER 50+WOMEN) TABS     . sertraline (ZOLOFT) 50 MG tablet Take 1 tablet (50 mg total) by mouth daily. 90 tablet 3  . Tdap (BOOSTRIX) 5-2.5-18.5 LF-MCG/0.5 injection Boostrix Tdap 2.5 Lf unit-8 mcg-5 Lf/0.5 mL intramuscular syringe  TO BE ADMINISTERED BY PHARMACIST FOR IMMUNIZATION    . scopolamine (TRANSDERM-SCOP) 1 MG/3DAYS Place 1 patch (1.5 mg total) onto the skin every 3 (three) days. (Patient not taking: Reported on 01/01/2018) 10 patch 0   No current facility-administered medications on file prior to visit.     There are no Patient Instructions on file for this visit. No follow-ups on file.   Kris Hartmann, NP  This note was completed with Sales executive.  Any errors are purely unintentional.

## 2018-03-27 ENCOUNTER — Encounter: Payer: Self-pay | Admitting: Obstetrics and Gynecology

## 2018-03-29 ENCOUNTER — Other Ambulatory Visit: Payer: Self-pay | Admitting: Obstetrics and Gynecology

## 2018-03-29 MED ORDER — NORETHINDRONE 0.35 MG PO TABS: 1.0000 | ORAL_TABLET | Freq: Every day | ORAL | 0 refills | Status: DC

## 2018-03-29 NOTE — Progress Notes (Signed)
Rx camila for [facility]. DUB eval was neg with EMB. Start with menses.

## 2018-04-10 ENCOUNTER — Encounter: Payer: Self-pay | Admitting: Family

## 2018-04-11 ENCOUNTER — Other Ambulatory Visit: Payer: Self-pay | Admitting: Family

## 2018-04-11 DIAGNOSIS — F419 Anxiety disorder, unspecified: Secondary | ICD-10-CM

## 2018-04-11 MED ORDER — SERTRALINE HCL 50 MG PO TABS: 100.0000 mg | ORAL_TABLET | Freq: Every day | ORAL | 3 refills | Status: DC

## 2018-04-23 ENCOUNTER — Other Ambulatory Visit: Payer: Self-pay

## 2018-04-23 DIAGNOSIS — Z760 Encounter for issue of repeat prescription: Secondary | ICD-10-CM

## 2018-04-23 MED ORDER — ATORVASTATIN CALCIUM 20 MG PO TABS: 20.0000 mg | ORAL_TABLET | Freq: Every day | ORAL | 1 refills | Status: DC

## 2018-06-06 ENCOUNTER — Other Ambulatory Visit: Payer: Self-pay | Admitting: Family

## 2018-06-06 ENCOUNTER — Encounter: Payer: Self-pay | Admitting: Family

## 2018-06-06 ENCOUNTER — Other Ambulatory Visit: Payer: Self-pay

## 2018-06-06 ENCOUNTER — Ambulatory Visit: Payer: Commercial Managed Care - PPO | Admitting: Family

## 2018-06-06 VITALS — BP 140/90 | HR 109 | Temp 98.9°F | Wt 235.0 lb

## 2018-06-06 DIAGNOSIS — N182 Chronic kidney disease, stage 2 (mild): Secondary | ICD-10-CM

## 2018-06-06 DIAGNOSIS — F419 Anxiety disorder, unspecified: Secondary | ICD-10-CM | POA: Diagnosis not present

## 2018-06-06 DIAGNOSIS — E119 Type 2 diabetes mellitus without complications: Secondary | ICD-10-CM | POA: Diagnosis not present

## 2018-06-06 DIAGNOSIS — J0100 Acute maxillary sinusitis, unspecified: Secondary | ICD-10-CM | POA: Diagnosis not present

## 2018-06-06 DIAGNOSIS — F411 Generalized anxiety disorder: Secondary | ICD-10-CM

## 2018-06-06 DIAGNOSIS — I10 Essential (primary) hypertension: Secondary | ICD-10-CM

## 2018-06-06 DIAGNOSIS — E1122 Type 2 diabetes mellitus with diabetic chronic kidney disease: Secondary | ICD-10-CM

## 2018-06-06 LAB — COMPREHENSIVE METABOLIC PANEL
ALT: 14 U/L (ref 0–35)
AST: 12 U/L (ref 0–37)
Albumin: 4.2 g/dL (ref 3.5–5.2)
Alkaline Phosphatase: 66 U/L (ref 39–117)
BUN: 8 mg/dL (ref 6–23)
CO2: 29 mEq/L (ref 19–32)
CREATININE: 0.64 mg/dL (ref 0.40–1.20)
Calcium: 9.3 mg/dL (ref 8.4–10.5)
Chloride: 102 mEq/L (ref 96–112)
GFR: 97.81 mL/min (ref >60.00)
Glucose, Bld: 214 mg/dL — ABNORMAL HIGH (ref 70–99)
Potassium: 3.8 mEq/L (ref 3.5–5.1)
Sodium: 138 mEq/L (ref 135–145)
Total Bilirubin: 0.6 mg/dL (ref 0.2–1.2)
Total Protein: 6.9 g/dL (ref 6.0–8.3)

## 2018-06-06 LAB — HEMOGLOBIN A1C: HEMOGLOBIN A1C: 6.6 % — AB (ref 4.6–6.5)

## 2018-06-06 MED ORDER — SERTRALINE HCL 50 MG PO TABS: 50.0000 mg | ORAL_TABLET | Freq: Every day | ORAL | 3 refills | Status: DC

## 2018-06-06 MED ORDER — AZITHROMYCIN 250 MG PO TABS: ORAL_TABLET | ORAL | 0 refills | Status: DC

## 2018-06-06 MED ORDER — AMLODIPINE BESYLATE 5 MG PO TABS: 5.0000 mg | ORAL_TABLET | Freq: Every day | ORAL | 1 refills | Status: DC

## 2018-06-06 NOTE — Assessment & Plan Note (Signed)
Elevated even prior to sinus infection. Will increase amlodipine. She will let me know if not at goal

## 2018-06-06 NOTE — Assessment & Plan Note (Signed)
Afebrile. Non toxic in appearance. Some improvement. Advised to continue mucinex and if no better to start [place]. She verbalized understanding of all.

## 2018-06-06 NOTE — Progress Notes (Signed)
Subjective:    Patient ID: Mia Oconnell, female    DOB: Feb 26, 1966, 53 y.o.   MRN: [id number]  CC: Shaela Boer is a 54 y.o. female who presents today for an acute visit.    HPI: CC: productive cough x 2 weeks, some improvement yesterday.   Endorses left sinus pressure, HA ( 'not severe).   Taken sudafed, clariin, mucinex with improvement.   No fever, wheezing, sob, CP  No sick contacts. No travel.   HTN-  At home, 130/80.    DM- compliant with metformin  GAD- zoloft 100mg  ( had been on 50mg )  without much difference. Would like to resume prior dose and pursue counseling.  Doing counseling which has helpful.      Non smoker. HISTORY:  Past Medical History:  Diagnosis Date  . Allergy   . Anxiety   . Diabetes mellitus without complication ([place])   . Hx of varicose vein stripping   . Hyperlipidemia   . Hypertension   . Vitamin D deficiency    Past Surgical History:  Procedure Laterality Date  . BREAST SURGERY    . COLONOSCOPY WITH PROPOFOL N/A 08/28/2017   Procedure: COLONOSCOPY WITH PROPOFOL;  Surgeon: Lin Landsman, MD;  [facility] ENDOSCOPY;  Service: Gastroenterology;  Laterality: N/A;  . SCLERAL BUCKLE    . VARICOSE VEIN SURGERY     Family History  Problem Relation Age of Onset  . Hyperlipidemia Mother   . Hypertension Mother   . Basal cell carcinoma Mother 13  . Hyperlipidemia Father   . Hypertension Father   . Alcohol abuse Maternal Uncle   . Hyperlipidemia Maternal Grandmother   . Hypertension Maternal Grandmother   . Hyperlipidemia Maternal Grandfather   . Heart disease Maternal Grandfather   . Hypertension Maternal Grandfather   . Hyperlipidemia Paternal Grandmother   . Hypertension Paternal Grandmother   . Cancer Paternal Grandfather        colon  . Hyperlipidemia Paternal Grandfather   . Hypertension Paternal Grandfather     Allergies: Lisinopril; Penicillins; and Sulfa antibiotics Current Outpatient Medications on File Prior  to Visit  Medication Sig Dispense Refill  . amLODipine (NORVASC) 2.5 MG tablet Take 1 tablet (2.5 mg total) by mouth daily. 90 tablet 3  . aspirin 81 MG tablet Take 81 mg by mouth daily.    Marland Kitchen atorvastatin (LIPITOR) 20 MG tablet Take 1 tablet (20 mg total) by mouth daily. 90 tablet 1  . Influenza vac split quadrivalent PF (FLUZONE QUADRIVALENT) 0.5 ML injection Fluzone Quad 2018-19(PF) 60 mcg(15 mcgx4)/0.5 mL intramuscular syringe  TO BE ADMINISTERED BY PHARMACIST FOR IMMUNIZATION    . loratadine (CLARITIN) 10 MG tablet Take 10 mg by mouth daily.    . metFORMIN (GLUCOPHAGE-XR) 500 MG 24 hr tablet TAKE 1 TABLET EVERY DAY WITH THE EVENING MEAL 90 tablet 3  . Multiple Vitamins-Minerals (CENTRUM SILVER 50+WOMEN) TABS     . norethindrone (MICRONOR,CAMILA,ERRIN) 0.35 MG tablet Take 1 tablet (0.35 mg total) by mouth daily. 84 tablet 0  . scopolamine (TRANSDERM-SCOP) 1 MG/3DAYS Place 1 patch (1.5 mg total) onto the skin every 3 (three) days. 10 patch 0  . Tdap (BOOSTRIX) 5-2.5-18.5 LF-MCG/0.5 injection Boostrix Tdap 2.5 Lf unit-8 mcg-5 Lf/0.5 mL intramuscular syringe  TO BE ADMINISTERED BY PHARMACIST FOR IMMUNIZATION     No current facility-administered medications on file prior to visit.     Social History   Tobacco Use  . Smoking status: Never Smoker  . Smokeless tobacco: Never Used  Substance Use Topics  . Alcohol use: No  . Drug use: No    Review of Systems  Constitutional: Negative for chills and fever.  HENT: Positive for congestion, sinus pressure and sinus pain. Negative for ear pain, facial swelling and sore throat.   Eyes: Negative for visual disturbance.  Respiratory: Positive for cough. Negative for shortness of breath and wheezing.   Cardiovascular: Negative for chest pain and palpitations.  Gastrointestinal: Negative for nausea and vomiting.      Objective:    BP 140/90   Pulse (!) 109   Temp 98.9 F (37.2 C) (Oral)   Wt 235 lb (106.6 kg)   SpO2 97%   BMI 36.81 kg/m     Physical Exam Vitals signs reviewed.  Constitutional:      Appearance: She is well-developed.  HENT:     Head: Normocephalic and atraumatic.     Right Ear: Hearing, tympanic membrane, ear canal and external ear normal. No decreased hearing noted. No drainage, swelling or tenderness. No middle ear effusion. No foreign body. Tympanic membrane is not erythematous or bulging.     Left Ear: Hearing, tympanic membrane, ear canal and external ear normal. No decreased hearing noted. No drainage, swelling or tenderness.  No middle ear effusion. No foreign body. Tympanic membrane is not erythematous or bulging.     Nose: Nose normal. No rhinorrhea.     Right Sinus: No maxillary sinus tenderness or frontal sinus tenderness.     Left Sinus: No maxillary sinus tenderness or frontal sinus tenderness.     Mouth/Throat:     Pharynx: Uvula midline. No oropharyngeal exudate or posterior oropharyngeal erythema.     Tonsils: No tonsillar abscesses.  Eyes:     Conjunctiva/sclera: Conjunctivae normal.  Cardiovascular:     Rate and Rhythm: Regular rhythm.     Pulses: Normal pulses.     Heart sounds: Normal heart sounds.  Pulmonary:     Effort: Pulmonary effort is normal.     Breath sounds: Normal breath sounds. No wheezing, rhonchi or rales.  Lymphadenopathy:     Head:     Right side of head: No submental, submandibular, tonsillar, preauricular, posterior auricular or occipital adenopathy.     Left side of head: No submental, submandibular, tonsillar, preauricular, posterior auricular or occipital adenopathy.     Cervical: No cervical adenopathy.  Skin:    General: Skin is warm and dry.  Neurological:     Mental Status: She is alert.  Psychiatric:        Speech: Speech normal.        Behavior: Behavior normal.        Thought Content: Thought content normal.        Assessment & Plan:   Problem List Items Addressed This Visit      Cardiovascular and Mediastinum   Essential hypertension     Elevated even prior to sinus infection. Will increase amlodipine. She will let me know if not at goal      Relevant Orders   Comprehensive metabolic panel     Respiratory   Acute non-recurrent maxillary sinusitis - Primary    Afebrile. Non toxic in appearance. Some improvement. Advised to continue mucinex and if no better to start [place]. She verbalized understanding of all.       Relevant Medications   azithromycin (ZITHROMAX) 250 MG tablet     Endocrine   DM (diabetes mellitus) ([place])    Pending a1c      Diabetes mellitus  without complication ([facility])   Relevant Orders   Hemoglobin A1c     Other   GAD (generalized anxiety disorder)    Will resume 50mg  zoloft. Declines trial of another SSRI. Would to pursue counseling and will let me know how she is doing.       Relevant Medications   sertraline (ZOLOFT) 50 MG tablet   Anxiety   Relevant Medications   sertraline (ZOLOFT) 50 MG tablet        I have changed Syndi Bail's sertraline. I am also having her start on azithromycin. Additionally, I am having her maintain her aspirin, loratadine, Tdap, Influenza vac split quadrivalent PF, Centrum Silver 50+Women, scopolamine, amLODipine, metFORMIN, norethindrone, and atorvastatin.   Meds ordered this encounter  Medications  . sertraline (ZOLOFT) 50 MG tablet    Sig: Take 1 tablet (50 mg total) by mouth at bedtime.    Dispense:  120 tablet    Refill:  3    Order Specific Question:   Supervising Provider    Answer:   Deborra Medina L [[id number]]  . azithromycin (ZITHROMAX) 250 MG tablet    Sig: Take 2 tablets ( total 500 mg) PO on day 1, then take 1 tablet ( total 250 mg) by mouth q24h x 4 days.    Dispense:  6 tablet    Refill:  0    Order Specific Question:   Supervising Provider    Answer:   Crecencio Mc [[id number]]    Return precautions given.   Risks, benefits, and alternatives of the medications and treatment plan prescribed today were discussed, and patient expressed  understanding.   Education regarding symptom management and diagnosis given to patient on AVS.  Continue to follow with Burnard Hawthorne, FNP for routine health maintenance.   Mia Oconnell and I agreed with plan.   Mable Paris, FNP

## 2018-06-06 NOTE — Telephone Encounter (Signed)
Sent to PCP for the [facility] to increase dose today and send in Rx.   Last OV 06/06/2018 today   Per you notes   " Elevated even prior to sinus infection. Will increase amlodipine. She will let me know if not at goal"

## 2018-06-06 NOTE — Assessment & Plan Note (Signed)
Pending a1c. 

## 2018-06-06 NOTE — Assessment & Plan Note (Signed)
Will resume 50mg  zoloft. Declines trial of another SSRI. Would to pursue counseling and will let me know how she is doing.

## 2018-06-06 NOTE — Patient Instructions (Addendum)
Increase amlodipine to 5mg .   Monitor blood pressure,  Goal is less than 120/80, based on newest guidelines; if persistently higher, please make sooner follow up appointment so we can recheck you blood pressure and manage medications  STOP sudafed.   Let me know how you are doing with counseling and if we need to change the zoloft  Continue Mucinex, plenty of water.  Start a Z-Pak as we discussed if no improvement.  Let me know how you are doing

## 2018-06-08 ENCOUNTER — Ambulatory Visit: Payer: Commercial Managed Care - PPO | Admitting: Family

## 2018-06-15 ENCOUNTER — Other Ambulatory Visit: Payer: Self-pay | Admitting: Obstetrics and Gynecology

## 2018-06-25 ENCOUNTER — Encounter: Payer: Self-pay | Admitting: Obstetrics and Gynecology

## 2018-07-02 ENCOUNTER — Encounter: Payer: Self-pay | Admitting: Family

## 2018-07-03 ENCOUNTER — Other Ambulatory Visit: Payer: Self-pay | Admitting: Obstetrics and Gynecology

## 2018-07-03 DIAGNOSIS — F419 Anxiety disorder, unspecified: Secondary | ICD-10-CM

## 2018-07-04 ENCOUNTER — Other Ambulatory Visit: Payer: Self-pay

## 2018-07-04 DIAGNOSIS — F419 Anxiety disorder, unspecified: Secondary | ICD-10-CM

## 2018-07-04 MED ORDER — SERTRALINE HCL 50 MG PO TABS: 50.0000 mg | ORAL_TABLET | Freq: Every day | ORAL | 3 refills | Status: DC

## 2018-07-04 NOTE — Telephone Encounter (Signed)
Please advise 

## 2018-07-05 ENCOUNTER — Ambulatory Visit: Payer: Commercial Managed Care - PPO | Admitting: Obstetrics and Gynecology

## 2018-08-22 ENCOUNTER — Ambulatory Visit: Payer: Commercial Managed Care - PPO | Admitting: Obstetrics and Gynecology

## 2018-09-19 ENCOUNTER — Ambulatory Visit: Payer: Commercial Managed Care - PPO | Admitting: Obstetrics and Gynecology

## 2018-09-26 ENCOUNTER — Other Ambulatory Visit: Payer: Self-pay | Admitting: Obstetrics and Gynecology

## 2018-09-27 ENCOUNTER — Encounter: Payer: Self-pay | Admitting: Obstetrics and Gynecology

## 2018-10-02 ENCOUNTER — Other Ambulatory Visit: Payer: Self-pay | Admitting: Family

## 2018-10-02 DIAGNOSIS — Z760 Encounter for issue of repeat prescription: Secondary | ICD-10-CM

## 2018-10-22 ENCOUNTER — Ambulatory Visit: Payer: Commercial Managed Care - PPO | Admitting: Obstetrics and Gynecology

## 2018-11-04 NOTE — Progress Notes (Signed)
PCP: Burnard Hawthorne, FNP   Chief Complaint  Patient presents with  . Gynecologic Exam    HPI:      Mia Oconnell is a 53 y.o. G0P0000 who LMP was Patient's last menstrual period was 10/22/2018 (exact date)., presents today for her annual examination.  Her menses are regular every 28-30 days, lasting 3-5 days, light to med flow.  Dysmenorrhea none. She had intermenstrual bleeding last yr with neg EMB 4/19. Started on camila for cycle control. Doing well.   Sex activity: single partner, contraception - POPs. She does not have vaginal dryness.  Last Pap: March 09, 2015  Results were: no abnormalities /neg HPV DNA.  Hx of STDs: none  Last mammogram: 06/30/17 at [facility], Results: no abnormalities, repeat in 1 yr There is no FH of breast cancer. There is no FH of ovarian cancer. The patient does do self-breast exams.  Colonoscopy: 6/19 with polyp; repeat due after 5 yrs  Tobacco use: The patient denies current or previous tobacco use. Alcohol use: none  Drug use: none Exercise: moderately active  She does get adequate calcium but not Vitamin D in her diet.  Labs/med problems managed by PCP now.  Pt takes sertraline 50 mg daily for anxiety with sx control, and I manage this. Anxiety sx not bad currently but wants to cont meds.   Past Medical History:  Diagnosis Date  . Allergy   . Anxiety   . Diabetes mellitus without complication ([place])   . Hx of varicose vein stripping   . Hyperlipidemia   . Hypertension   . Vitamin D deficiency     Past Surgical History:  Procedure Laterality Date  . BREAST SURGERY    . COLONOSCOPY WITH PROPOFOL N/A 08/28/2017   Procedure: COLONOSCOPY WITH PROPOFOL;  Surgeon: Lin Landsman, MD;  Location: [facility];  Service: Gastroenterology;  Laterality: N/A;  . SCLERAL BUCKLE    . VARICOSE VEIN SURGERY      Family History  Problem Relation Age of Onset  . Hyperlipidemia Mother   . Hypertension Mother   . Basal cell carcinoma  Mother 26  . Hyperlipidemia Father   . Hypertension Father   . Alcohol abuse Maternal Uncle   . Hyperlipidemia Maternal Grandmother   . Hypertension Maternal Grandmother   . Hyperlipidemia Maternal Grandfather   . Heart disease Maternal Grandfather   . Hypertension Maternal Grandfather   . Hyperlipidemia Paternal Grandmother   . Hypertension Paternal Grandmother   . Cancer Paternal Grandfather        colon  . Hyperlipidemia Paternal Grandfather   . Hypertension Paternal Grandfather     Social History   Socioeconomic History  . Marital status: Married    Spouse name: Not on file  . Number of children: Not on file  . Years of education: Not on file  . Highest education level: Not on file  Occupational History  . Not on file  Social Needs  . Financial resource strain: Not on file  . Food insecurity    Worry: Not on file    Inability: Not on file  . Transportation needs    Medical: Not on file    Non-medical: Not on file  Tobacco Use  . Smoking status: Never Smoker  . Smokeless tobacco: Never Used  Substance and Sexual Activity  . Alcohol use: No  . Drug use: No  . Sexual activity: Yes    Birth control/protection: Pill  Lifestyle  . Physical activity  Days per week: 0 days    Minutes per session: Not on file  . Stress: Not on file  Relationships  . Social Herbalist on phone: Not on file    Gets together: Not on file    Attends religious service: Not on file    Active member of club or organization: Not on file    Attends meetings of clubs or organizations: Not on file    Relationship status: Not on file  . Intimate partner violence    Fear of current or ex partner: Not on file    Emotionally abused: Not on file    Physically abused: Not on file    Forced sexual activity: Not on file  Other Topics Concern  . 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 amLODipine (NORVASC) 5 MG tablet Take 1 tablet (5 mg total) by mouth daily. 90 tablet 1  . atorvastatin (LIPITOR) 20 MG tablet TAKE 1 TABLET BY MOUTH EVERY DAY 90 tablet 1  . metFORMIN (GLUCOPHAGE-XR) 500 MG 24 hr tablet TAKE 1 TABLET EVERY DAY WITH THE EVENING MEAL 90 tablet 3  . Multiple Vitamins-Minerals (CENTRUM SILVER 50+WOMEN) TABS     . norethindrone (MICRONOR) 0.35 MG tablet TAKE 1 TABLET BY MOUTH EVERY DAY 84 tablet 0  . sertraline (ZOLOFT) 50 MG tablet Take 1 tablet (50 mg total) by mouth at bedtime. 90 tablet 3  . aspirin 81 MG tablet Take 81 mg by mouth daily.    Marland Kitchen azithromycin (ZITHROMAX) 250 MG tablet Take 2 tablets ( total 500 mg) PO on day 1, then take 1 tablet ( total 250 mg) by mouth q24h x 4 days. 6 tablet 0  . Influenza vac split quadrivalent PF (FLUZONE QUADRIVALENT) 0.5 ML injection Fluzone Quad 2018-19(PF) 60 mcg(15 mcgx4)/0.5 mL intramuscular syringe  TO BE ADMINISTERED BY PHARMACIST FOR IMMUNIZATION    . loratadine (CLARITIN) 10 MG tablet Take 10 mg by mouth daily.    Marland Kitchen scopolamine (TRANSDERM-SCOP) 1 MG/3DAYS Place 1 patch (1.5 mg total) onto the skin every 3 (three) days. 10 patch 0  . Tdap (BOOSTRIX) 5-2.5-18.5 LF-MCG/0.5 injection Boostrix Tdap 2.5 Lf unit-8 mcg-5 Lf/0.5 mL intramuscular syringe  TO BE ADMINISTERED BY PHARMACIST FOR IMMUNIZATION     No facility-administered medications prior to visit.        ROS:  Review of Systems  Constitutional: Negative for fatigue, fever and unexpected weight change.  Respiratory: Negative for cough, shortness of breath and wheezing.   Cardiovascular: Negative for chest pain, palpitations and leg swelling.  Gastrointestinal: Negative for blood in stool, constipation, diarrhea, nausea and vomiting.  Endocrine: Negative for cold intolerance, heat intolerance and polyuria.  Genitourinary: Negative for dyspareunia, dysuria, flank pain, frequency, genital sores, hematuria, menstrual problem,  pelvic pain, urgency, vaginal bleeding, vaginal discharge and vaginal pain.  Musculoskeletal: Negative for back pain, joint swelling and myalgias.  Skin: Negative for rash.  Neurological: Negative for dizziness, syncope, light-headedness, numbness and headaches.  Hematological: Negative for adenopathy.  Psychiatric/Behavioral: Negative for agitation, confusion, sleep disturbance and suicidal ideas. The patient is not nervous/anxious.    BREAST: No symptoms    Objective: BP 126/84   Ht 5\' 7"  (1.702 m)   Wt 249 lb (112.9 kg)   LMP 10/22/2018 (Exact Date)   BMI 39.00 kg/m    Physical Exam  Constitutional:      Appearance: She is well-developed.  Genitourinary:     Vulva, vagina, uterus, right adnexa and left adnexa normal.     No vulval lesion or tenderness noted.     No vaginal discharge, erythema or tenderness.     No cervical motion tenderness or polyp.     Uterus is not enlarged or tender.     No right or left adnexal mass present.     Right adnexa not tender.     Left adnexa not tender.  Neck:     Musculoskeletal: Normal range of motion.     Thyroid: No thyromegaly.  Cardiovascular:     Rate and Rhythm: Normal rate and regular rhythm.     Heart sounds: Normal heart sounds. No murmur.  Pulmonary:     Effort: Pulmonary effort is normal.     Breath sounds: Normal breath sounds.  Chest:     Breasts:        Right: No mass, nipple discharge, skin change or tenderness.        Left: No mass, nipple discharge, skin change or tenderness.  Abdominal:     Palpations: Abdomen is soft.     Tenderness: There is no abdominal tenderness. There is no guarding.  Musculoskeletal: Normal range of motion.  Neurological:     General: No focal deficit present.     Mental Status: She is alert and oriented to person, place, and time.     Cranial Nerves: No cranial nerve deficit.  Skin:    General: Skin is warm and dry.  Psychiatric:        Mood and Affect: Mood normal.         Behavior: Behavior normal.        Thought Content: Thought content normal.        Judgment: Judgment normal.  Vitals signs reviewed.    Assessment/Plan:  Encounter for annual routine gynecological examination -   Cervical cancer screening - Plan: Cytology - PAP,   Screening for HPV (human papillomavirus) - Plan: Cytology - PAP,   Encounter for surveillance of contraceptive pills - Plan: norethindrone (MICRONOR) 0.35 MG tablet, POP Rx RF. F/u prn.  Screening for breast cancer - Plan: MM 3D SCREEN BREAST BILATERAL, Pt to sched mammo   Anxiety - Rx RF zoloft. F/u prn.  - Plan: sertraline (ZOLOFT) 50 MG tablet,    Meds ordered this encounter  Medications  . sertraline (ZOLOFT) 50 MG tablet    Sig: Take 1 tablet (50 mg total) by mouth at bedtime.    Dispense:  90 tablet    Refill:  3    Order Specific Question:   Supervising Provider    Answer:   Gae Dry [id number]  . norethindrone (MICRONOR) 0.35 MG tablet    Sig: Take 1 tablet (0.35 mg total) by mouth daily.    Dispense:  84 tablet    Refill:  3    Order Specific Question:   Supervising Provider    Answer:   Gae Dry [[id number]]          GYN counsel breast self exam, mammography screening, menopause, adequate intake of calcium and vitamin D, diet and exercise    F/U  Return in about 1 year (around 11/05/2019).  Alicia B. Copland, PA-C 11/05/2018 9:04 AM

## 2018-11-05 ENCOUNTER — Other Ambulatory Visit (HOSPITAL_COMMUNITY)
Admission: RE | Admit: 2018-11-05 | Discharge: 2018-11-05 | Disposition: A | Discharge status: Home / Self Care | Payer: Commercial Managed Care - PPO | Source: Ambulatory Visit | Attending: Obstetrics and Gynecology | Admitting: Obstetrics and Gynecology

## 2018-11-05 ENCOUNTER — Ambulatory Visit (INDEPENDENT_AMBULATORY_CARE_PROVIDER_SITE_OTHER): Payer: Commercial Managed Care - PPO | Admitting: Obstetrics and Gynecology

## 2018-11-05 ENCOUNTER — Encounter: Payer: Self-pay | Admitting: Obstetrics and Gynecology

## 2018-11-05 ENCOUNTER — Encounter: Payer: Commercial Managed Care - PPO | Admitting: Family

## 2018-11-05 ENCOUNTER — Other Ambulatory Visit: Payer: Self-pay

## 2018-11-05 VITALS — BP 126/84 | Ht 67.0 in | Wt 249.0 lb

## 2018-11-05 DIAGNOSIS — Z1151 Encounter for screening for human papillomavirus (HPV): Secondary | ICD-10-CM | POA: Diagnosis present

## 2018-11-05 DIAGNOSIS — Z124 Encounter for screening for malignant neoplasm of cervix: Secondary | ICD-10-CM | POA: Insufficient documentation

## 2018-11-05 DIAGNOSIS — F419 Anxiety disorder, unspecified: Secondary | ICD-10-CM

## 2018-11-05 DIAGNOSIS — Z01419 Encounter for gynecological examination (general) (routine) without abnormal findings: Secondary | ICD-10-CM | POA: Diagnosis not present

## 2018-11-05 DIAGNOSIS — Z3041 Encounter for surveillance of contraceptive pills: Secondary | ICD-10-CM

## 2018-11-05 DIAGNOSIS — Z1239 Encounter for other screening for malignant neoplasm of breast: Secondary | ICD-10-CM

## 2018-11-05 MED ORDER — SERTRALINE HCL 50 MG PO TABS: 50.0000 mg | ORAL_TABLET | Freq: Every day | ORAL | 3 refills | Status: DC

## 2018-11-05 MED ORDER — NORETHINDRONE 0.35 MG PO TABS: 1.0000 | ORAL_TABLET | Freq: Every day | ORAL | 3 refills | Status: DC

## 2018-11-05 NOTE — Patient Instructions (Addendum)
I value your feedback and entrusting us with your care. If you get a [facility] patient survey, I would appreciate you taking the time to let us know about your experience today. Thank you!     [facility]: [phone]  

## 2018-11-07 LAB — CYTOLOGY - PAP
Adequacy: ABSENT
Diagnosis: NEGATIVE
HPV: NOT DETECTED

## 2018-11-09 ENCOUNTER — Encounter: Payer: Self-pay | Admitting: Obstetrics and Gynecology

## 2018-11-12 ENCOUNTER — Encounter: Payer: Self-pay | Admitting: Obstetrics and Gynecology

## 2018-11-13 ENCOUNTER — Encounter: Payer: Self-pay | Admitting: Family

## 2018-11-24 ENCOUNTER — Other Ambulatory Visit: Payer: Self-pay | Admitting: Family

## 2018-11-30 ENCOUNTER — Encounter: Payer: Commercial Managed Care - PPO | Admitting: Family

## 2018-12-03 ENCOUNTER — Other Ambulatory Visit: Payer: Self-pay

## 2018-12-03 ENCOUNTER — Encounter: Payer: Self-pay | Admitting: Family

## 2018-12-03 ENCOUNTER — Ambulatory Visit (INDEPENDENT_AMBULATORY_CARE_PROVIDER_SITE_OTHER): Payer: Commercial Managed Care - PPO | Admitting: Family

## 2018-12-03 VITALS — Ht 67.0 in | Wt 249.0 lb

## 2018-12-03 DIAGNOSIS — I10 Essential (primary) hypertension: Secondary | ICD-10-CM

## 2018-12-03 DIAGNOSIS — Z760 Encounter for issue of repeat prescription: Secondary | ICD-10-CM

## 2018-12-03 DIAGNOSIS — E119 Type 2 diabetes mellitus without complications: Secondary | ICD-10-CM

## 2018-12-03 DIAGNOSIS — E782 Mixed hyperlipidemia: Secondary | ICD-10-CM

## 2018-12-03 MED ORDER — METFORMIN HCL ER 500 MG PO TB24: ORAL_TABLET | ORAL | 3 refills | Status: DC

## 2018-12-03 MED ORDER — METFORMIN HCL 500 MG PO TABS: 500.0000 mg | ORAL_TABLET | Freq: Every day | ORAL | 3 refills | Status: DC

## 2018-12-03 MED ORDER — AMLODIPINE BESYLATE 5 MG PO TABS: 5.0000 mg | ORAL_TABLET | Freq: Every day | ORAL | 1 refills | Status: DC

## 2018-12-03 MED ORDER — ATORVASTATIN CALCIUM 20 MG PO TABS: 20.0000 mg | ORAL_TABLET | Freq: Every day | ORAL | 1 refills | Status: DC

## 2018-12-03 NOTE — Patient Instructions (Addendum)
Due for pneumococcal 23.   Labs    Stop metformin XR due to recall; start meformin (plain).   Stay safe!

## 2018-12-03 NOTE — Assessment & Plan Note (Signed)
At goal; continue regimen 

## 2018-12-03 NOTE — Assessment & Plan Note (Signed)
Will start plain metformin due to recall; pending a1c

## 2018-12-03 NOTE — Progress Notes (Signed)
This visit type was conducted due to national recommendations for restrictions regarding the COVID-19 pandemic (e.g. social distancing).  This format is felt to be most appropriate for this patient at this time.  All issues noted in this document were discussed and addressed.  No physical exam was performed (except for noted visual exam findings with Video Visits). Virtual Visit via Video Note  I connected with@  on 12/03/18 at  3:30 PM EDT by a video enabled telemedicine application and verified that I am speaking with the correct person using two identifiers.  Location patient: home Location provider:work  Persons participating in the virtual visit: patient, provider  I discussed the limitations of evaluation and management by telemedicine and the availability of in person appointments. The patient expressed understanding and agreed to proceed.   HPI:  Feels well. No complaints. Would like refills.   DM- compliant with metformin XR.   HTN- at home on norvasc. Denies exertional chest pain or pressure, numbness or tingling radiating to left arm or jaw, palpitations, dizziness, frequent headaches, changes in vision, or shortness of breath.   On zoloft 25mg  and feels well.   Mammogram UTD  ROS: See pertinent positives and negatives per HPI.  Past Medical History:  Diagnosis Date  . Allergy   . Anxiety   . Diabetes mellitus without complication ([place])   . Hx of varicose vein stripping   . Hyperlipidemia   . Hypertension   . Vitamin D deficiency     Past Surgical History:  Procedure Laterality Date  . BREAST SURGERY    . COLONOSCOPY WITH PROPOFOL N/A 08/28/2017   Procedure: COLONOSCOPY WITH PROPOFOL;  Surgeon: Lin Landsman, MD;  Location: [facility] END[facility];  Service: Gastroenterology;  Laterality: N/A;  . SCLERAL BUCKLE    . VARICOSE VEIN SURGERY      Family History  Problem Relation Age of Onset  . Hyperlipidemia Mother   . Hypertension Mother   . Basal cell carcinoma  Mother 49  . Hyperlipidemia Father   . Hypertension Father   . Alcohol abuse Maternal Uncle   . Hyperlipidemia Maternal Grandmother   . Hypertension Maternal Grandmother   . Hyperlipidemia Maternal Grandfather   . Heart disease Maternal Grandfather   . Hypertension Maternal Grandfather   . Hyperlipidemia Paternal Grandmother   . Hypertension Paternal Grandmother   . Cancer Paternal Grandfather        colon  . Hyperlipidemia Paternal Grandfather   . Hypertension Paternal Grandfather     SOCIAL HX: never smoker   Current Outpatient Medications:  .  amLODipine (NORVASC) 5 MG tablet, Take 1 tablet (5 mg total) by mouth daily., Disp: 90 tablet, Rfl: 1 .  atorvastatin (LIPITOR) 20 MG tablet, Take 1 tablet (20 mg total) by mouth daily., Disp: 90 tablet, Rfl: 1 .  metFORMIN (GLUCOPHAGE) 500 MG tablet, Take 1 tablet (500 mg total) by mouth daily with breakfast., Disp: 180 tablet, Rfl: 3 .  Multiple Vitamins-Minerals (CENTRUM SILVER 50+WOMEN) TABS, , Disp: , Rfl:  .  norethindrone (MICRONOR) 0.35 MG tablet, Take 1 tablet (0.35 mg total) by mouth daily., Disp: 84 tablet, Rfl: 3 .  sertraline (ZOLOFT) 50 MG tablet, Take 1 tablet (50 mg total) by mouth at bedtime., Disp: 90 tablet, Rfl: 3  EXAM:  VITALS per patient if applicable:  BP Readings from Last 3 Encounters:  11/05/18 126/84  06/06/18 140/90  01/08/18 134/78     GENERAL: alert, oriented, appears well and in no acute distress  HEENT: atraumatic,  conjunttiva clear, no obvious abnormalities on inspection of external nose and ears  NECK: normal movements of the head and neck  LUNGS: on inspection no signs of respiratory distress, breathing rate appears normal, no obvious gross SOB, gasping or wheezing  CV: no obvious cyanosis  MS: moves all visible extremities without noticeable abnormality  PSYCH/NEURO: pleasant and cooperative, no obvious depression or anxiety, speech and thought processing grossly intact  ASSESSMENT AND  PLAN:  Discussed the following assessment and plan:  Problem List Items Addressed This Visit      Cardiovascular and Mediastinum   Essential hypertension - Primary    At goal; continue regimen      Relevant Medications   atorvastatin (LIPITOR) 20 MG tablet   amLODipine (NORVASC) 5 MG tablet     Endocrine   Diabetes mellitus without complication (HCC)    Will start plain metformin due to recall; pending a1c      Relevant Medications   metFORMIN (GLUCOPHAGE) 500 MG tablet   atorvastatin (LIPITOR) 20 MG tablet   Other Relevant Orders   CBC with Differential/Platelet   Comprehensive metabolic panel   Hemoglobin A1c   Lipid panel   TSH   VITAMIN D 25 Hydroxy (Vit-D Deficiency, Fractures)   Microalbumin / creatinine urine ratio     Other   Hyperlipidemia   Relevant Medications   atorvastatin (LIPITOR) 20 MG tablet   amLODipine (NORVASC) 5 MG tablet    Other Visit Diagnoses    Prescription refill       Rx RF metformin and atorvastatin until pt f/u with PCP for labs/mgmt 6/19   Relevant Medications   atorvastatin (LIPITOR) 20 MG tablet      -we discussed possible serious and likely etiologies, options for evaluation and workup, limitations of telemedicine visit vs in person visit, treatment, treatment risks and precautions. Pt prefers to treat via telemedicine empirically rather then risking or undertaking an in person visit at this moment. Patient agrees to seek prompt in person care if worsening, new symptoms arise, or if is not improving with treatment.   I discussed the assessment and treatment plan with the patient. The patient was provided an opportunity to ask questions and all were answered. The patient agreed with the plan and demonstrated an understanding of the instructions.   The patient was advised to call back or seek an in-person evaluation if the symptoms worsen or if the condition fails to improve as anticipated.   Mable Paris, FNP

## 2019-03-03 ENCOUNTER — Encounter: Payer: Self-pay | Admitting: Family

## 2019-03-06 ENCOUNTER — Other Ambulatory Visit: Payer: Self-pay | Admitting: Family

## 2019-03-06 DIAGNOSIS — Z760 Encounter for issue of repeat prescription: Secondary | ICD-10-CM

## 2019-03-06 DIAGNOSIS — E782 Mixed hyperlipidemia: Secondary | ICD-10-CM

## 2019-03-06 MED ORDER — ATORVASTATIN CALCIUM 40 MG PO TABS: 40.0000 mg | ORAL_TABLET | Freq: Every day | ORAL | 1 refills | Status: DC

## 2019-04-05 ENCOUNTER — Encounter: Payer: Self-pay | Admitting: Family

## 2019-04-22 ENCOUNTER — Other Ambulatory Visit: Payer: Self-pay

## 2019-04-24 ENCOUNTER — Other Ambulatory Visit: Payer: Self-pay

## 2019-04-24 ENCOUNTER — Other Ambulatory Visit (INDEPENDENT_AMBULATORY_CARE_PROVIDER_SITE_OTHER): Payer: Commercial Managed Care - PPO

## 2019-04-24 ENCOUNTER — Encounter: Payer: Self-pay | Admitting: Family

## 2019-04-24 DIAGNOSIS — E119 Type 2 diabetes mellitus without complications: Secondary | ICD-10-CM

## 2019-04-24 DIAGNOSIS — E782 Mixed hyperlipidemia: Secondary | ICD-10-CM

## 2019-04-24 LAB — LIPID PANEL
Cholesterol: 105 mg/dL (ref 0–200)
HDL: 42.8 mg/dL (ref >39.00)
LDL Cholesterol: 51 mg/dL (ref 0–99)
NonHDL: 61.78
Total CHOL/HDL Ratio: 2
Triglycerides: 56 mg/dL (ref 0.0–149.0)
VLDL: 11.2 mg/dL (ref 0.0–40.0)

## 2019-04-24 LAB — CBC WITH DIFFERENTIAL/PLATELET
Basophils Absolute: 0 10*3/uL (ref 0.0–0.1)
Basophils Relative: 0.7 % (ref 0.0–3.0)
Eosinophils Absolute: 0.1 10*3/uL (ref 0.0–0.7)
Eosinophils Relative: 0.9 % (ref 0.0–5.0)
HCT: 40.6 % (ref 36.0–46.0)
Hemoglobin: 14 g/dL (ref 12.0–15.0)
Lymphocytes Relative: 27.8 % (ref 12.0–46.0)
Lymphs Abs: 2 10*3/uL (ref 0.7–4.0)
MCHC: 34.5 g/dL (ref 30.0–36.0)
MCV: 92.2 fl (ref 78.0–100.0)
Monocytes Absolute: 0.6 10*3/uL (ref 0.1–1.0)
Monocytes Relative: 8.2 % (ref 3.0–12.0)
Neutro Abs: 4.5 10*3/uL (ref 1.4–7.7)
Neutrophils Relative %: 62.4 % (ref 43.0–77.0)
Platelets: 201 10*3/uL (ref 150.0–400.0)
RBC: 4.4 Mil/uL (ref 3.87–5.11)
RDW: 13.3 % (ref 11.5–15.5)
WBC: 7.2 10*3/uL (ref 4.0–10.5)

## 2019-04-24 LAB — TSH: TSH: 1.96 u[IU]/mL (ref 0.35–4.50)

## 2019-04-24 LAB — COMPREHENSIVE METABOLIC PANEL
ALT: 18 U/L (ref 0–35)
AST: 18 U/L (ref 0–37)
Albumin: 4.4 g/dL (ref 3.5–5.2)
Alkaline Phosphatase: 54 U/L (ref 39–117)
BUN: 13 mg/dL (ref 6–23)
CO2: 26 mEq/L (ref 19–32)
Calcium: 9.6 mg/dL (ref 8.4–10.5)
Chloride: 103 mEq/L (ref 96–112)
Creatinine, Ser: 0.68 mg/dL (ref 0.40–1.20)
GFR: 90.88 mL/min (ref >60.00)
Glucose, Bld: 109 mg/dL — ABNORMAL HIGH (ref 70–99)
Potassium: 4 mEq/L (ref 3.5–5.1)
Sodium: 139 mEq/L (ref 135–145)
Total Bilirubin: 0.7 mg/dL (ref 0.2–1.2)
Total Protein: 6.9 g/dL (ref 6.0–8.3)

## 2019-04-24 LAB — MICROALBUMIN / CREATININE URINE RATIO
Creatinine,U: 257.2 mg/dL
Microalb Creat Ratio: 0.6 mg/g (ref 0.0–30.0)
Microalb, Ur: 1.7 mg/dL (ref 0.0–1.9)

## 2019-04-24 LAB — HEMOGLOBIN A1C: Hgb A1c MFr Bld: 6.3 % (ref 4.6–6.5)

## 2019-04-24 LAB — VITAMIN D 25 HYDROXY (VIT D DEFICIENCY, FRACTURES): VITD: 54.44 ng/mL (ref 30.00–100.00)

## 2019-05-07 ENCOUNTER — Encounter: Payer: Self-pay | Admitting: Family

## 2019-05-26 ENCOUNTER — Ambulatory Visit: Payer: Commercial Managed Care - PPO | Attending: Internal Medicine

## 2019-05-26 DIAGNOSIS — Z23 Encounter for immunization: Secondary | ICD-10-CM | POA: Insufficient documentation

## 2019-05-26 NOTE — Progress Notes (Signed)
   Covid-19 Vaccination Clinic  Name:  Juliane Aaker    MRN: [id number] DOB: 1965/05/25  05/26/2019  Ms. Kohlman was observed post Covid-19 immunization for 15 minutes without incident. She was provided with Vaccine Information Sheet and instruction to access the V-Safe system.   Ms. Haralson was instructed to call 911 with any severe reactions post vaccine: Marland Kitchen Difficulty breathing  . Swelling of face and throat  . A fast heartbeat  . A bad rash all over body  . Dizziness and weakness   Immunizations Administered    Name Date Dose VIS Date Route   Pfizer COVID-19 Vaccine 05/26/2019  1:20 PM 0.3 mL 03/01/2019 Intramuscular   Manufacturer: [place]   Lot: [id number]   [place]: [id number]

## 2019-05-27 ENCOUNTER — Encounter: Payer: Self-pay | Admitting: Family

## 2019-05-29 ENCOUNTER — Other Ambulatory Visit: Payer: Self-pay | Admitting: Family

## 2019-05-29 DIAGNOSIS — C4490 Unspecified malignant neoplasm of skin, unspecified: Secondary | ICD-10-CM

## 2019-06-09 ENCOUNTER — Encounter: Payer: Self-pay | Admitting: Family

## 2019-06-10 ENCOUNTER — Telehealth: Payer: Self-pay

## 2019-06-10 NOTE — Telephone Encounter (Signed)
I spoke with patient & advised that she really needed to be evaluated for orthostatics, cardiac etiology & her hydration status. She said that she was more hydrated than ever since she is on this weight loss plan she has been drinking lots of water. Pt was agreeable to being evaluated by Dawson Bills, NP on Thursday. I have her scheduled 8am.

## 2019-06-10 NOTE — Telephone Encounter (Signed)
Close  

## 2019-06-11 NOTE — Telephone Encounter (Signed)
I agree with need for evaluation.  It sounds like she has lost a lot of weight in a short period of time.  Making sure sugar ok, eating enough protein, checking labs.  We can do ekg here if needed.

## 2019-06-13 ENCOUNTER — Encounter: Payer: Self-pay | Admitting: Nurse Practitioner

## 2019-06-13 ENCOUNTER — Other Ambulatory Visit: Payer: Self-pay

## 2019-06-13 ENCOUNTER — Ambulatory Visit: Payer: Commercial Managed Care - PPO | Admitting: Nurse Practitioner

## 2019-06-13 VITALS — BP 118/74 | HR 68 | Temp 96.0°F | Ht 67.0 in | Wt 216.8 lb

## 2019-06-13 DIAGNOSIS — R42 Dizziness and giddiness: Secondary | ICD-10-CM

## 2019-06-13 DIAGNOSIS — E119 Type 2 diabetes mellitus without complications: Secondary | ICD-10-CM

## 2019-06-13 LAB — BASIC METABOLIC PANEL
BUN: 13 mg/dL (ref 6–23)
CO2: 28 mEq/L (ref 19–32)
Calcium: 9.4 mg/dL (ref 8.4–10.5)
Chloride: 104 mEq/L (ref 96–112)
Creatinine, Ser: 0.64 mg/dL (ref 0.40–1.20)
GFR: 97.42 mL/min (ref >60.00)
Glucose, Bld: 98 mg/dL (ref 70–99)
Potassium: 4.2 mEq/L (ref 3.5–5.1)
Sodium: 137 mEq/L (ref 135–145)

## 2019-06-13 NOTE — Progress Notes (Addendum)
Established Patient Office Visit  Subjective:  Patient ID: Mia Oconnell, female    DOB: 01/14/1966  Age: 54 y.o. MRN: [id number]  CC:  Chief Complaint  Patient presents with  . Acute Visit    DIZZINESS SINCE WEIGHT LOSS    HPI Mia Oconnell presents for a few spells of dizziness-descried as fleeting that have occurred recently. She had one in the shower and after standing up from the couch. This is not lightheaded feeling. More like room feels spinning for a seconds.  No HA, blurred vision, nasal or ear congestion,  N/V, or CP/SOB. She feels totally normal afterwards. She has noted no melena. She has a BP cuff at home, but never checks it. She is taking Norvasc 5 mg daily.   BP Readings from Last 3 Encounters:  06/13/19 118/74  11/05/18 126/84  06/06/18 140/90   Obesity/HLD:  She has had intense dieting with the [facility]- new name for [employer]. She eats one lean and green meals and fills in pre packaged foods. She has been feeling good on it. Sun afternoon- not sure if it was a BP or sugar or too raid wt loss thing. She ate more. Lost 31 lb since Jan- target wt is 175 lbs .  Wt Readings from Last 3 Encounters:  06/13/19 216 lb 12.8 oz (98.3 kg)  12/03/18 249 lb (112.9 kg)  11/05/18 249 lb (112.9 kg)   DM:She is on her stable dose of Metformin 500 mg daily. She does not check her blood sugars and does not have a glucometer.   Lab Results  Component Value Date   HGBA1C 6.3 04/24/2019   Anxiety/GAD: Increased the Zoloft from 50 mg to 100 mg in Feb. She is feeling much better and has a more  peaceful in brain.   Past Medical History:  Diagnosis Date  . Allergy   . Anxiety   . Diabetes mellitus without complication ([place])   . Hx of varicose vein stripping   . Hyperlipidemia   . Hypertension   . Vitamin D deficiency     Past Surgical History:  Procedure Laterality Date  . BREAST SURGERY    . COLONOSCOPY WITH PROPOFOL N/A 08/28/2017   Procedure: COLONOSCOPY  WITH PROPOFOL;  Surgeon: Lin Landsman, MD;  Location: [facility] ENDOSCOPY;  Service: Gastroenterology;  Laterality: N/A;  . SCLERAL BUCKLE    . VARICOSE VEIN SURGERY      Family History  Problem Relation Age of Onset  . Hyperlipidemia Mother   . Hypertension Mother   . Basal cell carcinoma Mother 29  . Hyperlipidemia Father   . Hypertension Father   . Alcohol abuse Maternal Uncle   . Hyperlipidemia Maternal Grandmother   . Hypertension Maternal Grandmother   . Hyperlipidemia Maternal Grandfather   . Heart disease Maternal Grandfather   . Hypertension Maternal Grandfather   . Hyperlipidemia Paternal Grandmother   . Hypertension Paternal Grandmother   . Cancer Paternal Grandfather        colon  . Hyperlipidemia Paternal Grandfather   . Hypertension Paternal Grandfather     Social History   Socioeconomic History  . Marital status: Married    Spouse name: Not on file  . Number of children: Not on file  . Years of education: Not on file  . Highest education level: Not on file  Occupational History  . Not on file  Tobacco Use  . Smoking status: Never Smoker  . Smokeless tobacco: Never Used  Substance and Sexual  Activity  . Alcohol use: No  . Drug use: No  . Sexual activity: Yes    Birth control/protection: Pill  Other Topics Concern  . 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 Difficulty of Paying Living Expenses:   Food Insecurity:   . Worried About Charity fundraiser in the Last Year:   . Arboriculturist in the Last Year:   Transportation Needs:   . Film/video editor (Medical):   Marland Kitchen Lack of Transportation (Non-Medical):   Physical Activity:   . Days of Exercise per Week:   . Minutes of Exercise per Session:   Stress:   . Feeling of Stress :   Social Connections:   . Frequency of Communication with Friends and Family:   . Frequency of Social  Gatherings with Friends and Family:   . Attends Religious Services:   . Active Member of Clubs or Organizations:   . Attends Archivist Meetings:   Marland Kitchen Marital Status:   Intimate Partner Violence:   . Fear of Current or Ex-Partner:   . Emotionally Abused:   Marland Kitchen Physically Abused:   . Sexually Abused:     Outpatient Medications Prior to Visit  Medication Sig Dispense Refill  . amLODipine (NORVASC) 5 MG tablet Take 1 tablet (5 mg total) by mouth daily. 90 tablet 1  . atorvastatin (LIPITOR) 40 MG tablet Take 1 tablet (40 mg total) by mouth daily at 6 PM. 90 tablet 1  . metFORMIN (GLUCOPHAGE) 500 MG tablet Take 1 tablet (500 mg total) by mouth daily with breakfast. 180 tablet 3  . Multiple Vitamins-Minerals (CENTRUM SILVER 50+WOMEN) TABS     . norethindrone (MICRONOR) 0.35 MG tablet Take 1 tablet (0.35 mg total) by mouth daily. 84 tablet 3  . sertraline (ZOLOFT) 50 MG tablet Take 1 tablet (50 mg total) by mouth at bedtime. 90 tablet 3   No facility-administered medications prior to visit.    Allergies  Allergen Reactions  . Lisinopril     cough  . Penicillins   . Sulfa Antibiotics     Review of Systems  Constitutional: Negative for chills and fever.  HENT: Negative.   Eyes: Negative.   Respiratory: Negative for cough, chest tightness and shortness of breath.   Cardiovascular: Negative for chest pain, palpitations and leg swelling.  Gastrointestinal: Negative for blood in stool.  Genitourinary: Negative.   Musculoskeletal: Negative.   Skin: Negative.   Neurological: Positive for dizziness. Negative for speech difficulty, weakness, light-headedness and numbness.  Hematological: Negative.   Psychiatric/Behavioral:       Feeling much better on Zoloft 100 mg. No depression or anxiety feelings now.       Objective:    Physical Exam  Constitutional: She is oriented to person, place, and time. She appears well-developed and well-nourished.  HENT:  Head: Normocephalic.   Eyes: Pupils are equal, round, and reactive to light. Conjunctivae are normal.  Cardiovascular: Normal rate, regular rhythm and normal heart sounds.  Pulmonary/Chest: Effort normal and breath sounds normal.  Musculoskeletal:     Cervical back: Normal range of motion and neck supple.  Neurological: She is alert and oriented to person, place, and time.  Skin: Skin is warm and dry.  Psychiatric: She has a normal mood and affect. Her behavior is normal. Judgment and thought content normal.  There were no vitals taken for this visit. Wt Readings from Last 3 Encounters:  12/03/18 249 lb (112.9 kg)  11/05/18 249 lb (112.9 kg)  06/06/18 235 lb (106.6 kg)   Negative orthostatic vitals: Supine with blood pressure 118/78 heart rate 60 Sitting blood pressure 115/80 heart rate 68 Standing 115/80 heart rate 76  Health Maintenance Due  Topic Date Due  . PNEUMOCOCCAL POLYSACCHARIDE VACCINE AGE 33-64 HIGH RISK  Never done  . FOOT EXAM  11/02/2018  . OPHTHALMOLOGY EXAM  05/22/2019    There are no preventive care reminders to display for this patient.  Lab Results  Component Value Date   TSH 1.96 04/24/2019   Lab Results  Component Value Date   WBC 7.2 04/24/2019   HGB 14.0 04/24/2019   HCT 40.6 04/24/2019   MCV 92.2 04/24/2019   PLT 201.0 04/24/2019   Lab Results  Component Value Date   NA 139 04/24/2019   K 4.0 04/24/2019   CO2 26 04/24/2019   GLUCOSE 109 (H) 04/24/2019   BUN 13 04/24/2019   CREATININE 0.68 04/24/2019   BILITOT 0.7 04/24/2019   ALKPHOS 54 04/24/2019   AST 18 04/24/2019   ALT 18 04/24/2019   PROT 6.9 04/24/2019   ALBUMIN 4.4 04/24/2019   CALCIUM 9.6 04/24/2019   GFR 90.88 04/24/2019   Lab Results  Component Value Date   CHOL 105 04/24/2019   Lab Results  Component Value Date   HDL 42.80 04/24/2019   Lab Results  Component Value Date   LDLCALC 51 04/24/2019   Lab Results  Component Value Date   TRIG 56.0 04/24/2019   Lab Results  Component  Value Date   CHOLHDL 2 04/24/2019   Lab Results  Component Value Date   HGBA1C 6.3 04/24/2019      Assessment & Plan:   Problem List Items Addressed This Visit    None     It was a pleasure to meet you today.  Please go to the lab today. We will check your fasting blood sugar and electrolytes after intense dieting and 31 lb weight loss since Jan.   Please monitor your BP at home-especially if you get a dizzy spell. If BP is 123456 systolic often and you keep getting these spells,  we may need to decrease your amlodipine dose to 2.5 mg or stop it all together and continue to monitor. A normal BP is <120/80.   If the blood sugar is good, the BP is at goal- which it is today, and you have a few dizzy spells- it may be time to increase you daily calories.   Please call us if having problems., otherwise see Joycelyn Schmid in 3 mos.   No orders of the defined types were placed in this encounter.   Follow-up: No follow-ups on file.    Denice Paradise, NP

## 2019-06-13 NOTE — Patient Instructions (Addendum)
It was a pleasure to meet you today.  Please go to the lab today. We will check your fasting blood sugar and electrolytes after intense dieting and 31 lb weight loss since Jan.   Please monitor your BP at home-especially if you get a dizzy spell. If BP is 123456 systolic often and you keep getting these spells,  we may need to decrease your amlodipine dose to 2.5 mg or stop it all together and continue to monitor.  Please call us if BP is running too low. A normal BP is <120/80.   If the blood sugar is good, the BP is at goal- which it is today, and you have a few dizzy spells- it may be time to increase you daily calories.   Please see Joycelyn Schmid in 3 mos.

## 2019-06-18 ENCOUNTER — Ambulatory Visit: Payer: Commercial Managed Care - PPO | Attending: Internal Medicine

## 2019-06-18 DIAGNOSIS — Z23 Encounter for immunization: Secondary | ICD-10-CM

## 2019-06-18 NOTE — Progress Notes (Signed)
   Covid-19 Vaccination Clinic  Name:  Mia Oconnell    MRN: [id number] DOB: [birth year]  06/18/2019  [name] was observed post Covid-19 immunization for 15 minutes without incident. She was provided with Vaccine Information Sheet and instruction to access the V-Safe system.   [name] was instructed to call 911 with any severe reactions post vaccine: Marland Kitchen Difficulty breathing  . Swelling of face and throat  . A fast heartbeat  . A bad rash all over body  . Dizziness and weakness   Immunizations Administered    Name Date Dose VIS Date Route   Pfizer COVID-19 Vaccine 06/18/2019  2:55 PM 0.3 mL 03/01/2019 Intramuscular   Manufacturer: [place]   Lot: [id number]   [place]: [id number]

## 2019-08-17 ENCOUNTER — Other Ambulatory Visit: Payer: Self-pay | Admitting: Family

## 2019-08-17 DIAGNOSIS — Z760 Encounter for issue of repeat prescription: Secondary | ICD-10-CM

## 2019-08-17 DIAGNOSIS — E782 Mixed hyperlipidemia: Secondary | ICD-10-CM

## 2019-09-16 ENCOUNTER — Ambulatory Visit (INDEPENDENT_AMBULATORY_CARE_PROVIDER_SITE_OTHER): Payer: Commercial Managed Care - PPO | Admitting: Family

## 2019-09-16 ENCOUNTER — Encounter: Payer: Self-pay | Admitting: Family

## 2019-09-16 ENCOUNTER — Other Ambulatory Visit: Payer: Self-pay

## 2019-09-16 ENCOUNTER — Other Ambulatory Visit: Payer: Self-pay | Admitting: Family

## 2019-09-16 VITALS — BP 118/82 | HR 64 | Temp 98.8°F | Ht 67.01 in | Wt 194.6 lb

## 2019-09-16 DIAGNOSIS — Z1159 Encounter for screening for other viral diseases: Secondary | ICD-10-CM | POA: Diagnosis not present

## 2019-09-16 DIAGNOSIS — I10 Essential (primary) hypertension: Secondary | ICD-10-CM

## 2019-09-16 DIAGNOSIS — E1122 Type 2 diabetes mellitus with diabetic chronic kidney disease: Secondary | ICD-10-CM

## 2019-09-16 DIAGNOSIS — F419 Anxiety disorder, unspecified: Secondary | ICD-10-CM

## 2019-09-16 DIAGNOSIS — N182 Chronic kidney disease, stage 2 (mild): Secondary | ICD-10-CM

## 2019-09-16 DIAGNOSIS — F411 Generalized anxiety disorder: Secondary | ICD-10-CM

## 2019-09-16 LAB — HEMOGLOBIN A1C: Hgb A1c MFr Bld: 5.5 % (ref 4.6–6.5)

## 2019-09-16 MED ORDER — SERTRALINE HCL 100 MG PO TABS: 100.0000 mg | ORAL_TABLET | Freq: Every day | ORAL | 1 refills | Status: DC

## 2019-09-16 MED ORDER — AMLODIPINE BESYLATE 5 MG PO TABS: 5.0000 mg | ORAL_TABLET | Freq: Every day | ORAL | 1 refills | Status: DC

## 2019-09-16 NOTE — Assessment & Plan Note (Signed)
Stable .continue norvasc

## 2019-09-16 NOTE — Patient Instructions (Signed)
Very impressed with weight loss!  Stay safe!

## 2019-09-16 NOTE — Assessment & Plan Note (Signed)
Extremely well controlled, especially in the setting of intentional weight loss.  Patient would like to come off Metformin which I think is reasonable.  We will await A1c prior to formally discontinuing.

## 2019-09-16 NOTE — Assessment & Plan Note (Addendum)
Stable, Doing well on Zoloft 100 mg.  I have refilled

## 2019-09-16 NOTE — Progress Notes (Signed)
Subjective:    Patient ID: Mia Oconnell, female    DOB: [birth year], 54 y.o.   MRN: [id number]  CC: Mia Oconnell is a 54 y.o. female who presents today for follow up.   HPI: Feels well today, no complaints   has lost weight intentionally following [employer]. She is now in maintenance. Low carb, high protein. Eating 5 meals per day.    Hypertension-compliant with Norvasc.  No chest pain, shortness of breath  DM-compliant Metformin.  Would like to know if she can come off this medication  anxiety-doing well on Zoloft 100mg .   Dizziness has resolved since seeing Maudie Mercury in march 2021. No further episodes and thinks related to not eating enough.  Follows with [facility] for micronor prescription.     HISTORY:  Past Medical History:  Diagnosis Date  . Allergy   . Anxiety   . Diabetes mellitus without complication ([place])   . Hx of varicose vein stripping   . Hyperlipidemia   . Hypertension   . Vitamin D deficiency    Past Surgical History:  Procedure Laterality Date  . BREAST SURGERY    . COLONOSCOPY WITH PROPOFOL N/A 08/28/2017   Procedure: COLONOSCOPY WITH PROPOFOL;  Surgeon: Lin Landsman, MD;  Location: [facility] ENDOSCOPY;  Service: Gastroenterology;  Laterality: N/A;  . SCLERAL BUCKLE    . VARICOSE VEIN SURGERY     Family History  Problem Relation Age of Onset  . Hyperlipidemia Mother   . Hypertension Mother   . Basal cell carcinoma Mother 61  . Hyperlipidemia Father   . Hypertension Father   . Alcohol abuse Maternal Uncle   . Hyperlipidemia Maternal Grandmother   . Hypertension Maternal Grandmother   . Hyperlipidemia Maternal Grandfather   . Heart disease Maternal Grandfather   . Hypertension Maternal Grandfather   . Hyperlipidemia Paternal Grandmother   . Hypertension Paternal Grandmother   . Cancer Paternal Grandfather        colon  . Hyperlipidemia Paternal Grandfather   . Hypertension Paternal Grandfather     Allergies: Lisinopril, Penicillins,  and Sulfa antibiotics Current Outpatient Medications on File Prior to Visit  Medication Sig Dispense Refill  . atorvastatin (LIPITOR) 40 MG tablet TAKE 1 TABLET BY MOUTH EVERY DAY AT 6 PM 90 tablet 1  . metFORMIN (GLUCOPHAGE) 500 MG tablet Take 1 tablet (500 mg total) by mouth daily with breakfast. 180 tablet 3  . norethindrone (MICRONOR) 0.35 MG tablet Take 1 tablet (0.35 mg total) by mouth daily. 84 tablet 3   No current facility-administered medications on file prior to visit.    Social History   Tobacco Use  . Smoking status: Never Smoker  . Smokeless tobacco: Never Used  Vaping Use  . Vaping Use: Never used  Substance Use Topics  . Alcohol use: No  . Drug use: No    Review of Systems  Constitutional: Negative for chills and fever.  Respiratory: Negative for cough.   Cardiovascular: Negative for chest pain and palpitations.  Gastrointestinal: Negative for nausea and vomiting.  Neurological: Negative for dizziness (resolved), syncope and weakness.      Objective:    BP 118/82 (BP Location: Left Arm, Patient Position: Sitting)   Pulse 64   Temp 98.8 F (37.1 C)   Ht 5' 7.01" (1.702 m)   Wt 194 lb 9.6 oz (88.3 kg)   SpO2 99%   BMI 30.47 kg/m  BP Readings from Last 3 Encounters:  09/16/19 118/82  06/13/19 118/74  11/05/18 126/84  Wt Readings from Last 3 Encounters:  09/16/19 194 lb 9.6 oz (88.3 kg)  06/13/19 216 lb 12.8 oz (98.3 kg)  12/03/18 249 lb (112.9 kg)    Physical Exam Vitals reviewed.  Constitutional:      Appearance: She is well-developed.  Eyes:     Conjunctiva/sclera: Conjunctivae normal.  Cardiovascular:     Rate and Rhythm: Normal rate and regular rhythm.     Pulses: Normal pulses.     Heart sounds: Normal heart sounds.  Pulmonary:     Effort: Pulmonary effort is normal.     Breath sounds: Normal breath sounds. No wheezing, rhonchi or rales.  Skin:    General: Skin is warm and dry.  Neurological:     Mental Status: She is alert.    Psychiatric:        Speech: Speech normal.        Behavior: Behavior normal.        Thought Content: Thought content normal.        Assessment & Plan:   Problem List Items Addressed This Visit      Cardiovascular and Mediastinum   Essential hypertension    Stable .continue norvasc      Relevant Medications   amLODipine (NORVASC) 5 MG tablet     Endocrine   DM (diabetes mellitus) ([place]) - Primary    Extremely well controlled, especially in the setting of intentional weight loss.  Patient would like to come off Metformin which I think is reasonable.  We will await A1c prior to formally discontinuing.      Relevant Orders   Hemoglobin A1c     Other   Anxiety   Relevant Medications   sertraline (ZOLOFT) 100 MG tablet   GAD (generalized anxiety disorder)    Stable, Doing well on Zoloft 100 mg.  I have refilled      Relevant Medications   sertraline (ZOLOFT) 100 MG tablet    Other Visit Diagnoses    Encounter for hepatitis C screening test for low risk patient       Relevant Orders   Hepatitis C antibody       I have discontinued [employer] Silver 50+Women. I have also changed her sertraline. Additionally, I am having her maintain her norethindrone, metFORMIN, atorvastatin, and amLODipine.   Meds ordered this encounter  Medications  . sertraline (ZOLOFT) 100 MG tablet    Sig: Take 1 tablet (100 mg total) by mouth at bedtime.    Dispense:  90 tablet    Refill:  1    Order Specific Question:   Supervising Provider    Answer:   Deborra Medina L [[id number]]  . amLODipine (NORVASC) 5 MG tablet    Sig: Take 1 tablet (5 mg total) by mouth daily.    Dispense:  90 tablet    Refill:  1    Order Specific Question:   Supervising Provider    Answer:   Crecencio Mc [[id number]]    Return precautions given.   Risks, benefits, and alternatives of the medications and treatment plan prescribed today were discussed, and patient expressed understanding.   Education  regarding symptom management and diagnosis given to patient on AVS.  Continue to follow with Burnard Hawthorne, FNP for routine health maintenance.   Mia Oconnell and I agreed with plan.   Mable Paris, FNP

## 2019-09-17 LAB — HEPATITIS C ANTIBODY
Hepatitis C Ab: NONREACTIVE
SIGNAL TO CUT-OFF: 0.01 (ref <1.00)

## 2019-10-09 ENCOUNTER — Other Ambulatory Visit: Payer: Self-pay | Admitting: Obstetrics and Gynecology

## 2019-10-09 DIAGNOSIS — Z3041 Encounter for surveillance of contraceptive pills: Secondary | ICD-10-CM

## 2019-10-15 ENCOUNTER — Other Ambulatory Visit: Payer: Self-pay | Admitting: Obstetrics and Gynecology

## 2019-10-15 DIAGNOSIS — F419 Anxiety disorder, unspecified: Secondary | ICD-10-CM

## 2019-10-15 NOTE — Telephone Encounter (Signed)
Annual 8/18

## 2019-10-29 ENCOUNTER — Other Ambulatory Visit: Payer: Self-pay | Admitting: Obstetrics and Gynecology

## 2019-10-29 DIAGNOSIS — Z3041 Encounter for surveillance of contraceptive pills: Secondary | ICD-10-CM

## 2019-11-06 ENCOUNTER — Ambulatory Visit (INDEPENDENT_AMBULATORY_CARE_PROVIDER_SITE_OTHER): Payer: Commercial Managed Care - PPO | Admitting: Obstetrics and Gynecology

## 2019-11-06 ENCOUNTER — Other Ambulatory Visit: Payer: Self-pay

## 2019-11-06 ENCOUNTER — Encounter: Payer: Self-pay | Admitting: Obstetrics and Gynecology

## 2019-11-06 VITALS — BP 120/90 | Ht 67.0 in | Wt 198.0 lb

## 2019-11-06 DIAGNOSIS — R159 Full incontinence of feces: Secondary | ICD-10-CM

## 2019-11-06 DIAGNOSIS — Z3041 Encounter for surveillance of contraceptive pills: Secondary | ICD-10-CM

## 2019-11-06 DIAGNOSIS — Z01419 Encounter for gynecological examination (general) (routine) without abnormal findings: Secondary | ICD-10-CM | POA: Diagnosis not present

## 2019-11-06 DIAGNOSIS — Z1231 Encounter for screening mammogram for malignant neoplasm of breast: Secondary | ICD-10-CM | POA: Diagnosis not present

## 2019-11-06 MED ORDER — NORETHINDRONE 0.35 MG PO TABS: 1.0000 | ORAL_TABLET | Freq: Every day | ORAL | 3 refills | Status: DC

## 2019-11-06 NOTE — Patient Instructions (Signed)
I value your feedback and entrusting us with your care. If you get a [facility] patient survey, I would appreciate you taking the time to let us know about your experience today. Thank you!  As of February 28, 2019, your lab results will be released to your MyChart immediately, before I even have a chance to see them. Please give me time to review them and contact you if there are any abnormalities. Thank you for your patience.  

## 2019-11-06 NOTE — Progress Notes (Signed)
PCP: Burnard Hawthorne, FNP   Chief Complaint  Patient presents with  . Gynecologic Exam    HPI:      Ms. Mia Oconnell is a 55 y.o. G0P0000 who LMP was Patient's last menstrual period was 10/16/2019 (approximate)., presents today for her annual examination.  Her menses are regular every 28-30 days, lasting 4 days, light to med flow on POPs.  Dysmenorrhea none. She had intermenstrual bleeding with neg EMB 4/19. Started on camila for cycle control. Doing well.   Sex activity: single partner, contraception - POPs. She does not have vaginal dryness.  Last Pap: 11/05/18  Results were: no abnormalities /neg HPV DNA.  Hx of STDs: none  Last mammogram: 11/09/18 at [facility], Results: no abnormalities, repeat in 1 yr. Has appt sched. There is no FH of breast cancer. There is no FH of ovarian cancer. The patient does do self-breast exams.  Colonoscopy: 6/19 with polyp; repeat due after 5 yrs. Is having occas fecal incontinence with "poop balls". No gas before. Long hx of constipation. Is doing the [employer] plan and sx probably since diet started 1/21.   Tobacco use: The patient denies current or previous tobacco use. Alcohol use: none  Drug use: none Exercise: moderately active  She does get adequate calcium and Vitamin D in her diet.  Labs/med problems managed by PCP now.  Has lost 50-55# since 1/21.  Past Medical History:  Diagnosis Date  . Allergy   . Anxiety   . Diabetes mellitus without complication ([place])   . Hx of varicose vein stripping   . Hyperlipidemia   . Hypertension   . Vitamin D deficiency     Past Surgical History:  Procedure Laterality Date  . BREAST SURGERY    . COLONOSCOPY WITH PROPOFOL N/A 08/28/2017   Procedure: COLONOSCOPY WITH PROPOFOL;  Surgeon: Lin Landsman, MD;  Location: [facility] ENDOSCOPY;  Service: Gastroenterology;  Laterality: N/A;  . SCLERAL BUCKLE    . VARICOSE VEIN SURGERY      Family History  Problem Relation Age of Onset  .  Hyperlipidemia Mother   . Hypertension Mother   . Basal cell carcinoma Mother 12  . Hyperlipidemia Father   . Hypertension Father   . Hyperlipidemia Maternal Grandmother   . Hypertension Maternal Grandmother   . Hyperlipidemia Maternal Grandfather   . Heart disease Maternal Grandfather   . Hypertension Maternal Grandfather   . Hyperlipidemia Paternal Grandmother   . Hypertension Paternal Grandmother   . Cancer Paternal Grandfather        colon  . Hyperlipidemia Paternal Grandfather   . Hypertension Paternal Grandfather   . Alcohol abuse Paternal Uncle     Social History   Socioeconomic History  . Marital status: Married    Spouse name: Not on file  . Number of children: Not on file  . Years of education: Not on file  . Highest education level: Not on file  Occupational History  . Not on file  Tobacco Use  . Smoking status: Never Smoker  . Smokeless tobacco: Never Used  Vaping Use  . Vaping Use: Never used  Substance and Sexual Activity  . Alcohol use: No  . Drug use: No  . Sexual activity: Yes    Birth control/protection: Pill  Other Topics Concern  . 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  Difficulty of Paying Living Expenses:   Food Insecurity:   . Worried About Charity fundraiser in the Last Year:   . Arboriculturist in the Last Year:   Transportation Needs:   . Film/video editor (Medical):   Marland Kitchen Lack of Transportation (Non-Medical):   Physical Activity:   . Days of Exercise per Week:   . Minutes of Exercise per Session:   Stress:   . Feeling of Stress :   Social Connections:   . Frequency of Communication with Friends and Family:   . Frequency of Social Gatherings with Friends and Family:   . Attends Religious Services:   . Active Member of Clubs or Organizations:   . Attends Archivist Meetings:   Marland Kitchen Marital Status:   Intimate  Partner Violence:   . Fear of Current or Ex-Partner:   . Emotionally Abused:   Marland Kitchen Physically Abused:   . Sexually Abused:     Outpatient Medications Prior to Visit  Medication Sig Dispense Refill  . amLODipine (NORVASC) 5 MG tablet Take 1 tablet (5 mg total) by mouth daily. 90 tablet 1  . atorvastatin (LIPITOR) 40 MG tablet TAKE 1 TABLET BY MOUTH EVERY DAY AT 6 PM 90 tablet 1  . sertraline (ZOLOFT) 100 MG tablet Take 1 tablet (100 mg total) by mouth at bedtime. 90 tablet 1  . norethindrone (MICRONOR) 0.35 MG tablet TAKE 1 TABLET BY MOUTH EVERY DAY 28 tablet 0   No facility-administered medications prior to visit.       ROS:  Review of Systems  Constitutional: Negative for fatigue, fever and unexpected weight change.  Respiratory: Negative for cough, shortness of breath and wheezing.   Cardiovascular: Negative for chest pain, palpitations and leg swelling.  Gastrointestinal: Negative for blood in stool, constipation, diarrhea, nausea and vomiting.  Endocrine: Negative for cold intolerance, heat intolerance and polyuria.  Genitourinary: Negative for dyspareunia, dysuria, flank pain, frequency, genital sores, hematuria, menstrual problem, pelvic pain, urgency, vaginal bleeding, vaginal discharge and vaginal pain.  Musculoskeletal: Negative for back pain, joint swelling and myalgias.  Skin: Negative for rash.  Neurological: Negative for dizziness, syncope, light-headedness, numbness and headaches.  Hematological: Negative for adenopathy.  Psychiatric/Behavioral: Negative for agitation, confusion, sleep disturbance and suicidal ideas. The patient is not nervous/anxious.    BREAST: No symptoms    Objective: BP 120/90   Ht 5\' 7"  (1.702 m)   Wt 198 lb (89.8 kg)   LMP 10/16/2019 (Approximate)   BMI 31.01 kg/m    Physical Exam Constitutional:      Appearance: She is well-developed.  Genitourinary:     Vulva, vagina, uterus, right adnexa and left adnexa normal.     No vulval  lesion or tenderness noted.     No vaginal discharge, erythema or tenderness.     No cervical motion tenderness or polyp.     Uterus is not enlarged or tender.     No right or left adnexal mass present.     Right adnexa not tender.     Left adnexa not tender.  Neck:     Thyroid: No thyromegaly.  Cardiovascular:     Rate and Rhythm: Normal rate and regular rhythm.     Heart sounds: Normal heart sounds. No murmur heard.   Pulmonary:     Effort: Pulmonary effort is normal.     Breath sounds: Normal breath sounds.  Chest:     Breasts:        Right:  No mass, nipple discharge, skin change or tenderness.        Left: No mass, nipple discharge, skin change or tenderness.  Abdominal:     Palpations: Abdomen is soft.     Tenderness: There is no abdominal tenderness. There is no guarding.  Musculoskeletal:        General: Normal range of motion.     Cervical back: Normal range of motion.  Neurological:     General: No focal deficit present.     Mental Status: She is alert and oriented to person, place, and time.     Cranial Nerves: No cranial nerve deficit.  Skin:    General: Skin is warm and dry.  Psychiatric:        Mood and Affect: Mood normal.        Behavior: Behavior normal.        Thought Content: Thought content normal.        Judgment: Judgment normal.  Vitals reviewed.    Assessment/Plan: Encounter for annual routine gynecological examination  Encounter for surveillance of contraceptive pills - Plan: norethindrone (MICRONOR) 0.35 MG tablet; Rx RF camila. Discussed perimenopause. F/u prn.  Encounter for screening mammogram for malignant neoplasm of breast; pt has appt sched.  Incontinence of feces, unspecified fecal incontinence type--Question diet related. Increase fiber to bulk up stool. Does have slightly decreased sphincter tone. Can f/u with GI prn.   Meds ordered this encounter  Medications  . norethindrone (MICRONOR) 0.35 MG tablet    Sig: Take 1 tablet (0.35  mg total) by mouth daily.    Dispense:  84 tablet    Refill:  3    Order Specific Question:   Supervising Provider    Answer:   Gae Dry [[id number]]          GYN counsel breast self exam, mammography screening, menopause, adequate intake of calcium and vitamin D, diet and exercise    F/U  Return in about 1 year (around 11/05/2020).  Ivannah Zody B. [name] Diss, PA-C 11/06/2019 4:49 PM

## 2019-11-14 ENCOUNTER — Encounter: Payer: Self-pay | Admitting: Obstetrics and Gynecology

## 2019-11-18 ENCOUNTER — Encounter: Payer: Self-pay | Admitting: Obstetrics and Gynecology

## 2019-12-04 ENCOUNTER — Telehealth: Payer: Self-pay | Admitting: Family

## 2019-12-04 NOTE — Telephone Encounter (Signed)
lvm for pt to reschedule appointment on 01/20/20

## 2020-01-20 ENCOUNTER — Ambulatory Visit: Payer: Commercial Managed Care - PPO | Admitting: Family

## 2020-01-22 ENCOUNTER — Encounter: Payer: Self-pay | Admitting: Family

## 2020-01-22 ENCOUNTER — Other Ambulatory Visit: Payer: Self-pay

## 2020-01-22 ENCOUNTER — Telehealth: Payer: Self-pay | Admitting: Family

## 2020-01-22 ENCOUNTER — Telehealth (INDEPENDENT_AMBULATORY_CARE_PROVIDER_SITE_OTHER): Payer: Commercial Managed Care - PPO | Admitting: Family

## 2020-01-22 VITALS — Ht 67.0 in | Wt 198.0 lb

## 2020-01-22 DIAGNOSIS — K6289 Other specified diseases of anus and rectum: Secondary | ICD-10-CM | POA: Insufficient documentation

## 2020-01-22 DIAGNOSIS — E119 Type 2 diabetes mellitus without complications: Secondary | ICD-10-CM

## 2020-01-22 DIAGNOSIS — I10 Essential (primary) hypertension: Secondary | ICD-10-CM | POA: Diagnosis not present

## 2020-01-22 DIAGNOSIS — F411 Generalized anxiety disorder: Secondary | ICD-10-CM

## 2020-01-22 MED ORDER — HYDROCORTISONE ACETATE 25 MG RE SUPP: 25.0000 mg | Freq: Two times a day (BID) | RECTAL | 0 refills | Status: DC

## 2020-01-22 NOTE — Telephone Encounter (Signed)
LMTCB and schedule follow up appt  33m follow up  A1c in 1-2 weeks Please place note to give occult stool cards when she comes for lab

## 2020-01-22 NOTE — Progress Notes (Signed)
Virtual Visit via Video Note  I connected with@  on 01/24/20 at 10:30 AM EDT by a video enabled telemedicine application and verified that I am speaking with the correct person using two identifiers.  Location patient: home Location provider:work  Persons participating in the virtual visit: patient, provider  I discussed the limitations of evaluation and management by telemedicine and the availability of in person appointments. The patient expressed understanding and agreed to proceed.   HPI: Acute visit and follow up Complains of 'bulge' from rectum, comes and goes. Noticed 3-4 months ago, unchanged.  Not sure if hemorrhoid.  Most bothersome is fecal incontinence in which describes hard balls. Mass is size of blueberry and blue in color. Nontender. No blood from rectum. Stool is not pencil in shape. Chronic constipation and may go a 1-4 days between bowel movement. Last BM yesterday. Denies straining to have BM.  No fever, abdominal pain, weight loss.   Pleased to be off metformin.  Compliant with norvasc. No cp, sob Doing well on zoloft   [facility], Dr Kellie Moor; she asks for new referral for annual follow up   ROS: See pertinent positives and negatives per HPI.    EXAM:  VITALS per patient if applicable: Ht 5\' 7"  (1.702 m)   Wt 198 lb (89.8 kg)   BMI 31.01 kg/m  BP Readings from Last 3 Encounters:  11/06/19 120/90  09/16/19 118/82  06/13/19 118/74   Wt Readings from Last 3 Encounters:  01/22/20 198 lb (89.8 kg)  11/06/19 198 lb (89.8 kg)  09/16/19 194 lb 9.6 oz (88.3 kg)    GENERAL: alert, oriented, appears well and in no acute distress  HEENT: atraumatic, conjunttiva clear, no obvious abnormalities on inspection of external nose and ears  NECK: normal movements of the head and neck  LUNGS: on inspection no signs of respiratory distress, breathing rate appears normal, no obvious gross SOB, gasping or wheezing  CV: no obvious cyanosis  MS:  moves all visible extremities without noticeable abnormality  PSYCH/NEURO: pleasant and cooperative, no obvious depression or anxiety, speech and thought processing grossly intact  ASSESSMENT AND PLAN:  Discussed the following assessment and plan:  Problem List Items Addressed This Visit      Cardiovascular and Mediastinum   Essential hypertension    Stable, continue norvasc 5mg .        Endocrine   Diabetes mellitus without complication ([place]) - Primary    Diet controlled. Pending a1c      Relevant Orders   Hemoglobin A1c     Other   GAD (generalized anxiety disorder)    Stable. Continue zoloft 100mg .      Rectal mass    Unable to assess over virtual. Symptom most consistent with  Hemorrhoid. Pending stool cards, trial of anusol, referral to GI for further evaluation.       Relevant Medications   hydrocortisone (ANUSOL-HC) 25 MG suppository   Other Relevant Orders   Fecal occult blood, imunochemical   Ambulatory referral to Gastroenterology   Ambulatory referral to Dermatology      -we discussed possible serious and likely etiologies, options for evaluation and workup, limitations of telemedicine visit vs in person visit, treatment, treatment risks and precautions. Pt prefers to treat via telemedicine empirically rather then risking or undertaking an in person visit at this moment.  .   I discussed the assessment and treatment plan with the patient. The patient was provided an opportunity to ask questions and all were answered. The patient  agreed with the plan and demonstrated an understanding of the instructions.   The patient was advised to call back or seek an in-person evaluation if the symptoms worsen or if the condition fails to improve as anticipated.   Mable Paris, FNP

## 2020-01-24 NOTE — Assessment & Plan Note (Signed)
Diet controlled. Pending a1c ?

## 2020-01-24 NOTE — Assessment & Plan Note (Signed)
Stable, continue norvasc 5mg .

## 2020-01-24 NOTE — Assessment & Plan Note (Signed)
Stable. Continue zoloft 100mg .

## 2020-01-24 NOTE — Patient Instructions (Addendum)
Referral to GI Let us know if you dont hear back within a week in regards to an appointment being scheduled.   Trial of anu sol Please return stool cards

## 2020-01-24 NOTE — Assessment & Plan Note (Signed)
Unable to assess over virtual. Symptom most consistent with  Hemorrhoid. Pending stool cards, trial of anusol, referral to GI for further evaluation.

## 2020-02-11 ENCOUNTER — Other Ambulatory Visit: Payer: Self-pay | Admitting: Family

## 2020-02-11 DIAGNOSIS — E782 Mixed hyperlipidemia: Secondary | ICD-10-CM

## 2020-02-11 DIAGNOSIS — Z760 Encounter for issue of repeat prescription: Secondary | ICD-10-CM

## 2020-03-03 ENCOUNTER — Other Ambulatory Visit: Payer: Self-pay

## 2020-03-03 ENCOUNTER — Ambulatory Visit
Admission: EM | Admit: 2020-03-03 | Discharge: 2020-03-03 | Disposition: A | Discharge status: Home / Self Care | Payer: Commercial Managed Care - PPO | Attending: Family Medicine | Admitting: Family Medicine

## 2020-03-03 ENCOUNTER — Telehealth: Payer: Self-pay | Admitting: Family

## 2020-03-03 ENCOUNTER — Encounter: Payer: Self-pay | Admitting: Emergency Medicine

## 2020-03-03 ENCOUNTER — Encounter: Payer: Self-pay | Admitting: Family

## 2020-03-03 DIAGNOSIS — R300 Dysuria: Secondary | ICD-10-CM | POA: Diagnosis present

## 2020-03-03 LAB — POCT URINALYSIS DIP (MANUAL ENTRY)
Bilirubin, UA: NEGATIVE
Glucose, UA: NEGATIVE mg/dL
Nitrite, UA: NEGATIVE
Protein Ur, POC: >=300 mg/dL — AB
Spec Grav, UA: >=1.03 — AB (ref 1.010–1.025)
Urobilinogen, UA: 0.2 E.U./dL
pH, UA: 5 (ref 5.0–8.0)

## 2020-03-03 MED ORDER — NITROFURANTOIN MONOHYD MACRO 100 MG PO CAPS: 100.0000 mg | ORAL_CAPSULE | Freq: Two times a day (BID) | ORAL | 0 refills | Status: DC

## 2020-03-03 NOTE — Discharge Instructions (Addendum)
Treating you for a urinary tract infection.  Take the medication as prescribed. Increase fluids.  Follow up as needed for continued or worsening symptoms

## 2020-03-03 NOTE — Telephone Encounter (Signed)
Patient called and thinks she has a UTI, no appointments are available in office at time of phone call. Judson Roch wants patient to go to urgent care.

## 2020-03-03 NOTE — ED Triage Notes (Signed)
Patient c/o burning w/ urination, blood in urine, and increased frequency w/ urination starting this morning.   Patient denies fever or ABD pain.   Patient hasn't tried any medications for symptoms.

## 2020-03-03 NOTE — ED Provider Notes (Signed)
[facility]    CSN: [id number] Arrival date & time: 03/03/20  1418      History   Chief Complaint Chief Complaint  Patient presents with  . Dysuria    HPI Mia Oconnell is a 54 y.o. female.   Patient is a 54 year old female who presents today with acute onset of dysuria, hematuria, frequency of urination that started this morning.  Symptoms have been constant.  Has not had any abdominal pain, flank pain, fevers.     Past Medical History:  Diagnosis Date  . Allergy   . Anxiety   . Diabetes mellitus without complication ([place])   . Hx of varicose vein stripping   . Hyperlipidemia   . Hypertension   . Vitamin D deficiency     Patient Active Problem List   Diagnosis Date Noted  . Rectal mass 01/22/2020  . Acute non-recurrent maxillary sinusitis 06/06/2018  . Hepatitis A vaccination not up to date 11/01/2017  . Routine physical examination   . DUB (dysfunctional uterine bleeding) 07/14/2017  . Diabetes mellitus without complication ([place])   . Anxiety   . Leg pain 10/24/2016  . Chronic venous insufficiency 10/24/2016  . Varicose veins of both lower extremities with pain 10/24/2016  . Hyperlipidemia 10/24/2016  . Essential hypertension 10/18/2016  . DM (diabetes mellitus) ([place]) 10/18/2016  . GAD (generalized anxiety disorder) 10/18/2016    Past Surgical History:  Procedure Laterality Date  . BREAST SURGERY    . COLONOSCOPY WITH PROPOFOL N/A 08/28/2017   Procedure: COLONOSCOPY WITH PROPOFOL;  Surgeon: Lin Landsman, MD;  Location: [facility] ENDOSCOPY;  Service: Gastroenterology;  Laterality: N/A;  . SCLERAL BUCKLE    . VARICOSE VEIN SURGERY      OB History    Gravida  3   Para  3   Term  3   Preterm  0   AB  0   Living  3     SAB  0   IAB  0   Ectopic  0   Multiple  0   Live Births  3            Home Medications    Prior to Admission medications   Medication Sig Start Date End Date Taking? Authorizing Provider   amLODipine (NORVASC) 5 MG tablet Take 1 tablet (5 mg total) by mouth daily. 09/16/19  Yes Burnard Hawthorne, FNP  atorvastatin (LIPITOR) 40 MG tablet TAKE 1 TABLET BY MOUTH EVERY DAY AT 6 PM 02/11/20  Yes Arnett, Yvetta Coder, FNP  norethindrone (MICRONOR) 0.35 MG tablet Take 1 tablet (0.35 mg total) by mouth daily. 6/94/85  Yes Copland, Elmo Putt B, PA-C  sertraline (ZOLOFT) 100 MG tablet Take 1 tablet (100 mg total) by mouth at bedtime. 09/16/19  Yes Arnett, Yvetta Coder, FNP  hydrocortisone (ANUSOL-HC) 25 MG suppository Place 1 suppository (25 mg total) rectally 2 (two) times daily. 01/22/20   Burnard Hawthorne, FNP  nitrofurantoin, macrocrystal-monohydrate, (MACROBID) 100 MG capsule Take 1 capsule (100 mg total) by mouth 2 (two) times daily. 03/03/20   Orvan July, NP    Family History Family History  Problem Relation Age of Onset  . Hyperlipidemia Mother   . Hypertension Mother   . Basal cell carcinoma Mother 31  . Hyperlipidemia Father   . Hypertension Father   . Hyperlipidemia Maternal Grandmother   . Hypertension Maternal Grandmother   . Hyperlipidemia Maternal Grandfather   . Heart disease Maternal Grandfather   . Hypertension Maternal Grandfather   .  Hyperlipidemia Paternal Grandmother   . Hypertension Paternal Grandmother   . Cancer Paternal Grandfather        colon  . Hyperlipidemia Paternal Grandfather   . Hypertension Paternal Grandfather   . Alcohol abuse Paternal Uncle     Social History Social History   Tobacco Use  . Smoking status: Never Smoker  . Smokeless tobacco: Never Used  Vaping Use  . Vaping Use: Never used  Substance Use Topics  . Alcohol use: No  . Drug use: No     Allergies   Lisinopril, Penicillins, and Sulfa antibiotics   Review of Systems Review of Systems   Physical Exam Triage Vital Signs ED Triage Vitals  Enc Vitals Group     BP 03/03/20 1427 138/84     Pulse Rate 03/03/20 1427 72     Resp 03/03/20 1427 15     Temp 03/03/20  1427 99.1 F (37.3 C)     Temp Source 03/03/20 1427 Oral     SpO2 03/03/20 1427 96 %     Weight 03/03/20 1425 200 lb (90.7 kg)     Height 03/03/20 1425 5\' 7"  (1.702 m)     Head Circumference --      Peak Flow --      Pain Score 03/03/20 1425 0     Pain Loc --      Pain Edu? --      Excl. in [place]? --    No data found.  Updated Vital Signs BP 138/84 (BP Location: Right Arm)   Pulse 72   Temp 99.1 F (37.3 C) (Oral)   Resp 15   Ht 5\' 7"  (1.702 m)   Wt 200 lb (90.7 kg)   LMP 02/27/2020   SpO2 96%   BMI 31.32 kg/m   Visual Acuity Right Eye Distance:   Left Eye Distance:   Bilateral Distance:    Right Eye Near:   Left Eye Near:    Bilateral Near:     Physical Exam Vitals and nursing note reviewed.  Constitutional:      General: She is not in acute distress.    Appearance: Normal appearance. She is not ill-appearing, toxic-appearing or diaphoretic.  HENT:     Head: Normocephalic.     Nose: Nose normal.  Eyes:     Conjunctiva/sclera: Conjunctivae normal.  Pulmonary:     Effort: Pulmonary effort is normal.  Musculoskeletal:        General: Normal range of motion.     Cervical back: Normal range of motion.  Skin:    General: Skin is warm and dry.     Findings: No rash.  Neurological:     Mental Status: She is alert.  Psychiatric:        Mood and Affect: Mood normal.      UC Treatments / Results  Labs (all labs ordered are listed, but only abnormal results are displayed) Labs Reviewed  POCT URINALYSIS DIP (MANUAL ENTRY) - Abnormal; Notable for the following components:      Result Value   Color, UA brown (*)    Clarity, UA cloudy (*)    Ketones, POC UA trace (5) (*)    Spec Grav, UA >=1.030 (*)    Blood, UA large (*)    Protein Ur, POC >=300 (*)    Leukocytes, UA Small (1+) (*)    All other components within normal limits  URINE CULTURE    EKG   Radiology No results found.  Procedures  Procedures (including critical care time)  Medications  Ordered in UC Medications - No data to display  Initial Impression / Assessment and Plan / UC Course  I have reviewed the triage vital signs and the nursing notes.  Pertinent labs & imaging results that were available during my care of the patient were reviewed by me and considered in my medical decision making (see chart for details).     Dysuria Urine with small leuks and large blood.  Sending for culture.  Will treat with antibiotics in the meantime.  Treating with Macrobid. Recommended push fluids Follow up as needed for continued or worsening symptoms  Final Clinical Impressions(s) / UC Diagnoses   Final diagnoses:  Dysuria     Discharge Instructions     Treating you for a urinary tract infection.  Take the medication as prescribed. Increase fluids.  Follow up as needed for continued or worsening symptoms     ED Prescriptions    Medication Sig Dispense Auth. Provider   nitrofurantoin, macrocrystal-monohydrate, (MACROBID) 100 MG capsule Take 1 capsule (100 mg total) by mouth 2 (two) times daily. 10 capsule Loura Halt A, NP     PDMP not reviewed this encounter.   Orvan July, NP 03/03/20 1528

## 2020-03-04 ENCOUNTER — Other Ambulatory Visit: Payer: Self-pay | Admitting: Family

## 2020-03-04 DIAGNOSIS — I10 Essential (primary) hypertension: Secondary | ICD-10-CM

## 2020-03-04 DIAGNOSIS — F419 Anxiety disorder, unspecified: Secondary | ICD-10-CM

## 2020-03-05 LAB — URINE CULTURE: Culture: 50000 — AB

## 2020-04-01 ENCOUNTER — Ambulatory Visit: Payer: Commercial Managed Care - PPO | Admitting: Gastroenterology

## 2020-05-15 ENCOUNTER — Encounter: Payer: Self-pay | Admitting: Family

## 2020-05-19 ENCOUNTER — Telehealth: Payer: Self-pay | Admitting: Family

## 2020-05-19 DIAGNOSIS — Z0000 Encounter for general adult medical examination without abnormal findings: Secondary | ICD-10-CM

## 2020-05-19 NOTE — Telephone Encounter (Signed)
Patient's insurance called and said that patient's referral for dermatology needs to be renewed yearly. Referrals need to be called into [facility] at [phone]

## 2020-05-21 NOTE — Telephone Encounter (Signed)
[name] & FYI [name]: Can new referral be placed for patient? I called the number provided & it is for providers. It seems that when referral is placed it needs to be faxed to [phone] or submitted online. Never have we had to do this before for any patient's insurance company that I am aware of? I happy to fax though if you will place new referral for patient. Has to be done every new year.

## 2020-05-22 NOTE — Telephone Encounter (Signed)
Good morning!  Let me know if still need to be done, so we don't send twice. Thank you!

## 2020-05-22 NOTE — Telephone Encounter (Signed)
I have redone referral Can you fax per referral notes?

## 2020-05-25 NOTE — Telephone Encounter (Signed)
Call pt Please let her know below

## 2020-05-25 NOTE — Telephone Encounter (Signed)
Good morning!  Referral was faxed to pt insurance and I also faxed it to [facility] just in case they needed a copy as well.

## 2020-05-25 NOTE — Telephone Encounter (Signed)
Pt called and informed of below.

## 2020-06-24 ENCOUNTER — Ambulatory Visit: Payer: Commercial Managed Care - PPO | Admitting: Family

## 2020-08-04 ENCOUNTER — Other Ambulatory Visit: Payer: Self-pay | Admitting: Family

## 2020-08-04 DIAGNOSIS — Z760 Encounter for issue of repeat prescription: Secondary | ICD-10-CM

## 2020-08-04 DIAGNOSIS — E782 Mixed hyperlipidemia: Secondary | ICD-10-CM

## 2020-08-25 ENCOUNTER — Other Ambulatory Visit: Payer: Self-pay | Admitting: Family

## 2020-08-25 DIAGNOSIS — F419 Anxiety disorder, unspecified: Secondary | ICD-10-CM

## 2020-08-25 DIAGNOSIS — I10 Essential (primary) hypertension: Secondary | ICD-10-CM

## 2020-10-06 ENCOUNTER — Other Ambulatory Visit: Payer: Self-pay | Admitting: Obstetrics and Gynecology

## 2020-10-06 DIAGNOSIS — Z3041 Encounter for surveillance of contraceptive pills: Secondary | ICD-10-CM

## 2020-11-10 ENCOUNTER — Ambulatory Visit: Payer: Commercial Managed Care - PPO | Admitting: Obstetrics and Gynecology

## 2020-12-29 ENCOUNTER — Ambulatory Visit: Payer: Commercial Managed Care - PPO | Admitting: Obstetrics and Gynecology

## 2021-01-01 ENCOUNTER — Other Ambulatory Visit: Payer: Self-pay | Admitting: Obstetrics and Gynecology

## 2021-01-01 DIAGNOSIS — Z3041 Encounter for surveillance of contraceptive pills: Secondary | ICD-10-CM

## 2021-01-19 ENCOUNTER — Encounter: Payer: Self-pay | Admitting: Obstetrics and Gynecology

## 2021-01-20 ENCOUNTER — Encounter: Payer: Self-pay | Admitting: Obstetrics and Gynecology

## 2021-01-26 ENCOUNTER — Ambulatory Visit: Payer: Commercial Managed Care - PPO | Admitting: Obstetrics and Gynecology

## 2021-02-02 ENCOUNTER — Other Ambulatory Visit: Payer: Self-pay | Admitting: Family

## 2021-02-02 DIAGNOSIS — E782 Mixed hyperlipidemia: Secondary | ICD-10-CM

## 2021-02-02 DIAGNOSIS — Z760 Encounter for issue of repeat prescription: Secondary | ICD-10-CM

## 2021-02-18 ENCOUNTER — Other Ambulatory Visit: Payer: Self-pay | Admitting: Family

## 2021-02-18 DIAGNOSIS — I10 Essential (primary) hypertension: Secondary | ICD-10-CM

## 2021-02-18 DIAGNOSIS — F419 Anxiety disorder, unspecified: Secondary | ICD-10-CM

## 2021-07-28 ENCOUNTER — Other Ambulatory Visit: Payer: Self-pay | Admitting: Family

## 2021-07-28 DIAGNOSIS — Z760 Encounter for issue of repeat prescription: Secondary | ICD-10-CM

## 2021-07-28 DIAGNOSIS — E782 Mixed hyperlipidemia: Secondary | ICD-10-CM

## 2021-08-18 ENCOUNTER — Other Ambulatory Visit: Payer: Self-pay | Admitting: Family

## 2021-08-18 DIAGNOSIS — F419 Anxiety disorder, unspecified: Secondary | ICD-10-CM

## 2021-08-18 DIAGNOSIS — I10 Essential (primary) hypertension: Secondary | ICD-10-CM

## 2021-08-18 NOTE — Telephone Encounter (Signed)
LMTCB TO SCHEDULE APPOINTMENT. PT NOT SEE SEEN SINCE 2021.

## 2021-08-26 ENCOUNTER — Other Ambulatory Visit: Payer: Self-pay | Admitting: Family

## 2021-08-26 DIAGNOSIS — Z760 Encounter for issue of repeat prescription: Secondary | ICD-10-CM

## 2021-08-26 DIAGNOSIS — E782 Mixed hyperlipidemia: Secondary | ICD-10-CM

## 2021-09-24 ENCOUNTER — Other Ambulatory Visit: Payer: Self-pay | Admitting: Family

## 2021-09-24 DIAGNOSIS — E782 Mixed hyperlipidemia: Secondary | ICD-10-CM

## 2021-09-24 DIAGNOSIS — Z760 Encounter for issue of repeat prescription: Secondary | ICD-10-CM

## 2021-10-06 DIAGNOSIS — S52502A Unspecified fracture of the lower end of left radius, initial encounter for closed fracture: Secondary | ICD-10-CM | POA: Insufficient documentation

## 2021-10-21 ENCOUNTER — Other Ambulatory Visit: Payer: Self-pay | Admitting: Family

## 2021-10-21 DIAGNOSIS — E782 Mixed hyperlipidemia: Secondary | ICD-10-CM

## 2021-10-21 DIAGNOSIS — Z760 Encounter for issue of repeat prescription: Secondary | ICD-10-CM

## 2021-10-22 ENCOUNTER — Ambulatory Visit: Payer: Commercial Managed Care - PPO | Admitting: Family

## 2021-11-10 ENCOUNTER — Ambulatory Visit: Payer: Commercial Managed Care - PPO | Admitting: Family

## 2021-11-17 ENCOUNTER — Other Ambulatory Visit: Payer: Self-pay | Admitting: Family

## 2021-11-17 DIAGNOSIS — Z760 Encounter for issue of repeat prescription: Secondary | ICD-10-CM

## 2021-11-17 DIAGNOSIS — E782 Mixed hyperlipidemia: Secondary | ICD-10-CM

## 2021-12-16 ENCOUNTER — Other Ambulatory Visit: Payer: Self-pay | Admitting: Family

## 2021-12-16 DIAGNOSIS — E782 Mixed hyperlipidemia: Secondary | ICD-10-CM

## 2021-12-16 DIAGNOSIS — Z760 Encounter for issue of repeat prescription: Secondary | ICD-10-CM

## 2021-12-27 ENCOUNTER — Ambulatory Visit: Payer: Commercial Managed Care - PPO | Admitting: Family

## 2022-01-08 ENCOUNTER — Other Ambulatory Visit: Payer: Self-pay | Admitting: Family

## 2022-01-08 DIAGNOSIS — F419 Anxiety disorder, unspecified: Secondary | ICD-10-CM

## 2022-01-08 DIAGNOSIS — I10 Essential (primary) hypertension: Secondary | ICD-10-CM

## 2022-01-10 ENCOUNTER — Other Ambulatory Visit: Payer: Self-pay | Admitting: Family

## 2022-01-10 DIAGNOSIS — Z760 Encounter for issue of repeat prescription: Secondary | ICD-10-CM

## 2022-01-10 DIAGNOSIS — E782 Mixed hyperlipidemia: Secondary | ICD-10-CM

## 2022-01-28 ENCOUNTER — Ambulatory Visit: Payer: Commercial Managed Care - PPO | Admitting: Family

## 2022-04-01 ENCOUNTER — Ambulatory Visit: Payer: Commercial Managed Care - PPO | Admitting: Family

## 2022-04-13 ENCOUNTER — Ambulatory Visit: Payer: Commercial Managed Care - PPO | Admitting: Family

## 2022-05-10 ENCOUNTER — Ambulatory Visit: Payer: Commercial Managed Care - PPO | Admitting: Family

## 2022-05-31 ENCOUNTER — Ambulatory Visit: Payer: Commercial Managed Care - PPO | Admitting: Family

## 2022-06-10 ENCOUNTER — Ambulatory Visit: Payer: Commercial Managed Care - PPO | Admitting: Family

## 2022-06-21 ENCOUNTER — Ambulatory Visit: Payer: Commercial Managed Care - PPO | Admitting: Family

## 2022-07-07 ENCOUNTER — Other Ambulatory Visit: Payer: Self-pay | Admitting: Family

## 2022-07-07 DIAGNOSIS — E782 Mixed hyperlipidemia: Secondary | ICD-10-CM

## 2022-07-07 DIAGNOSIS — Z760 Encounter for issue of repeat prescription: Secondary | ICD-10-CM

## 2022-07-18 ENCOUNTER — Encounter: Payer: Self-pay | Admitting: Family

## 2022-07-18 ENCOUNTER — Ambulatory Visit: Payer: Commercial Managed Care - PPO | Admitting: Family

## 2022-07-18 VITALS — BP 134/80 | HR 95 | Temp 98.5°F | Ht 67.0 in | Wt 241.4 lb

## 2022-07-18 DIAGNOSIS — Z1231 Encounter for screening mammogram for malignant neoplasm of breast: Secondary | ICD-10-CM

## 2022-07-18 DIAGNOSIS — R2241 Localized swelling, mass and lump, right lower limb: Secondary | ICD-10-CM

## 2022-07-18 DIAGNOSIS — E669 Obesity, unspecified: Secondary | ICD-10-CM | POA: Diagnosis not present

## 2022-07-18 DIAGNOSIS — E1122 Type 2 diabetes mellitus with diabetic chronic kidney disease: Secondary | ICD-10-CM | POA: Diagnosis not present

## 2022-07-18 DIAGNOSIS — N182 Chronic kidney disease, stage 2 (mild): Secondary | ICD-10-CM

## 2022-07-18 DIAGNOSIS — Z8639 Personal history of other endocrine, nutritional and metabolic disease: Secondary | ICD-10-CM

## 2022-07-18 DIAGNOSIS — Z1211 Encounter for screening for malignant neoplasm of colon: Secondary | ICD-10-CM

## 2022-07-18 DIAGNOSIS — Z136 Encounter for screening for cardiovascular disorders: Secondary | ICD-10-CM

## 2022-07-18 DIAGNOSIS — I10 Essential (primary) hypertension: Secondary | ICD-10-CM

## 2022-07-18 DIAGNOSIS — Z13220 Encounter for screening for lipoid disorders: Secondary | ICD-10-CM

## 2022-07-18 NOTE — Patient Instructions (Addendum)
Referral to Dr Allegra Lai, [facility]   Let us know if you dont hear back within a week in regards to an appointment being scheduled.   So that you are aware, if you are [facility] MyChart user , please pay attention to your MyChart messages as you may receive a MyChart message with a phone number to call and schedule this test/appointment own your own from our referral coordinator. This is a new process so I do not want you to miss this message.  If you are not a MyChart user, you will receive a phone call.    Monitor blood pressure at home and me 5-6 reading on separate days. Goal is less than 120/80, based on newest guidelines, however we certainly want to be less than 130/80;  if persistently higher, please make sooner follow up appointment so we can recheck you blood pressure and manage/ adjust medications.

## 2022-07-18 NOTE — Assessment & Plan Note (Signed)
Slightly elevated today.  Advised patient to send me blood pressure readings from home to further decide if amlodipine needs to be increased.  Continue amlodipine 5 mg for now

## 2022-07-18 NOTE — Progress Notes (Unsigned)
   Assessment & Plan:  There are no diagnoses linked to this encounter.   Return precautions given.   Risks, benefits, and alternatives of the medications and treatment plan prescribed today were discussed, and patient expressed understanding.   Education regarding symptom management and diagnosis given to patient on AVS either electronically or printed.  No follow-ups on file.  Rennie Plowman, FNP  Subjective:    Patient ID: Mia Oconnell, female    DOB: [birth year], 57 y.o.   MRN: [id number]  CC: Mia Oconnell is a 57 y.o. female who presents today for follow up.   HPI: Right lateral thigh 'lump' for years. It was unchanged. Untender.   Not red, draining.    Otherwise, she feels today.  No new complaints.  She remains frustrated by weight.  Previously on metformin.       Allergies: Lisinopril, Penicillins, and Sulfa antibiotics Current Outpatient Medications on File Prior to Visit  Medication Sig Dispense Refill   amLODipine (NORVASC) 5 MG tablet TAKE 1 TABLET BY MOUTH EVERY DAY 90 tablet 1   atorvastatin (LIPITOR) 40 MG tablet TAKE 1 TABLET BY MOUTH EVERY DAY AT 6 PM 90 tablet 3   sertraline (ZOLOFT) 100 MG tablet TAKE 1 TABLET BY MOUTH EVERYDAY AT BEDTIME 90 tablet 1   No current facility-administered medications on file prior to visit.    Review of Systems  Constitutional:  Negative for chills and fever.  Respiratory:  Negative for cough.   Cardiovascular:  Negative for chest pain and palpitations.  Gastrointestinal:  Negative for nausea and vomiting.  Skin:  Negative for rash and wound.      Objective:    BP 134/80   Pulse 95   Temp 98.5 F (36.9 C) (Oral)   Ht 5\' 7"  (1.702 m)   Wt 241 lb 6.4 oz (109.5 kg)   SpO2 97%   BMI 37.81 kg/m  BP Readings from Last 3 Encounters:  07/18/22 134/80  03/03/20 138/84  11/06/19 120/90   Wt Readings from Last 3 Encounters:  07/18/22 241 lb 6.4 oz (109.5 kg)  03/03/20 200 lb (90.7 kg)  01/22/20 198 lb  (89.8 kg)    Physical Exam Vitals reviewed.  Constitutional:      Appearance: She is well-developed.  Eyes:     Conjunctiva/sclera: Conjunctivae normal.  Cardiovascular:     Rate and Rhythm: Normal rate and regular rhythm.     Pulses: Normal pulses.     Heart sounds: Normal heart sounds.  Pulmonary:     Effort: Pulmonary effort is normal.     Breath sounds: Normal breath sounds. No wheezing, rhonchi or rales.  Musculoskeletal:       Legs:     Comments: 2-3 well-circumscribed mass right lateral thigh.  Nonfluctuant.  Nontender  Skin:    General: Skin is warm and dry.  Neurological:     Mental Status: She is alert.  Psychiatric:        Speech: Speech normal.        Behavior: Behavior normal.        Thought Content: Thought content normal.

## 2022-07-19 ENCOUNTER — Encounter: Payer: Self-pay | Admitting: Family

## 2022-07-19 NOTE — Assessment & Plan Note (Signed)
Presentation most consistent with lipoma.  Pending ultrasound.

## 2022-07-21 ENCOUNTER — Telehealth: Payer: Self-pay

## 2022-07-21 ENCOUNTER — Other Ambulatory Visit: Payer: Self-pay

## 2022-07-21 DIAGNOSIS — Z86010 Personal history of colonic polyps: Secondary | ICD-10-CM

## 2022-07-21 LAB — HM MAMMOGRAPHY

## 2022-07-21 MED ORDER — GOLYTELY 236 G PO SOLR: 4000.0000 mL | Freq: Once | ORAL | 0 refills | Status: AC

## 2022-07-21 NOTE — Telephone Encounter (Signed)
Patient has decided to keep colonoscopy as scheduled with Dr. Allegra Lai on 09/05/22.  Thanks,  [place], [place]

## 2022-07-21 NOTE — Telephone Encounter (Signed)
Gastroenterology Pre-Procedure Review  Request Date: 09/05/22 Requesting Physician: Dr. Allegra Lai  PATIENT REVIEW QUESTIONS: The patient responded to the following health history questions as indicated:    1. Are you having any GI issues? no 2. Do you have a personal history of Polyps? yes (last colonoscopy performed with Dr. Allegra Lai 08/28/2017) 3. Do you have a family history of Colon Cancer or Polyps? no 4. Diabetes Mellitus? no 5. Joint replacements in the past 12 months?no 6. Major health problems in the past 3 months?no 7. Any artificial heart valves, MVP, or defibrillator?no    MEDICATIONS & ALLERGIES:    Patient reports the following regarding taking any anticoagulation/antiplatelet therapy:   Plavix, Coumadin, Eliquis, Xarelto, Lovenox, Pradaxa, Brilinta, or Effient? no Aspirin? no  Patient confirms/reports the following medications:  Current Outpatient Medications  Medication Sig Dispense Refill   amLODipine (NORVASC) 5 MG tablet TAKE 1 TABLET BY MOUTH EVERY DAY 90 tablet 1   atorvastatin (LIPITOR) 40 MG tablet TAKE 1 TABLET BY MOUTH EVERY DAY AT 6 PM 90 tablet 3   sertraline (ZOLOFT) 100 MG tablet TAKE 1 TABLET BY MOUTH EVERYDAY AT BEDTIME 90 tablet 1   No current facility-administered medications for this visit.    Patient confirms/reports the following allergies:  Allergies  Allergen Reactions   Lisinopril     cough   Penicillins    Sulfa Antibiotics     No orders of the defined types were placed in this encounter.   AUTHORIZATION INFORMATION Primary Insurance: 1D#: Group #:  Secondary Insurance: 1D#: Group #:  SCHEDULE INFORMATION: Date: 09/05/22 Time: Location: [facility]

## 2022-07-21 NOTE — Telephone Encounter (Signed)
Pt left message to reschedule colonoscopy pt stated that around the 25th would be great  please return call

## 2022-07-22 ENCOUNTER — Other Ambulatory Visit: Payer: Self-pay | Admitting: Family

## 2022-07-22 DIAGNOSIS — R2241 Localized swelling, mass and lump, right lower limb: Secondary | ICD-10-CM

## 2022-07-26 ENCOUNTER — Other Ambulatory Visit: Payer: Self-pay | Admitting: Family

## 2022-07-26 DIAGNOSIS — F419 Anxiety disorder, unspecified: Secondary | ICD-10-CM

## 2022-07-26 DIAGNOSIS — I10 Essential (primary) hypertension: Secondary | ICD-10-CM

## 2022-07-28 ENCOUNTER — Ambulatory Visit
Admission: RE | Admit: 2022-07-28 | Discharge: 2022-07-28 | Disposition: A | Discharge status: Home / Self Care | Payer: Commercial Managed Care - PPO | Source: Ambulatory Visit | Attending: Family | Admitting: Family

## 2022-07-28 DIAGNOSIS — R2241 Localized swelling, mass and lump, right lower limb: Secondary | ICD-10-CM | POA: Insufficient documentation

## 2022-08-01 ENCOUNTER — Encounter: Payer: Self-pay | Admitting: Family

## 2022-08-01 ENCOUNTER — Encounter (INDEPENDENT_AMBULATORY_CARE_PROVIDER_SITE_OTHER): Payer: Self-pay | Admitting: Internal Medicine

## 2022-08-01 ENCOUNTER — Ambulatory Visit (INDEPENDENT_AMBULATORY_CARE_PROVIDER_SITE_OTHER): Payer: Commercial Managed Care - PPO | Admitting: Internal Medicine

## 2022-08-01 VITALS — BP 131/70 | HR 73 | Temp 98.6°F | Ht 67.0 in | Wt 241.0 lb

## 2022-08-01 DIAGNOSIS — E782 Mixed hyperlipidemia: Secondary | ICD-10-CM | POA: Diagnosis not present

## 2022-08-01 DIAGNOSIS — I10 Essential (primary) hypertension: Secondary | ICD-10-CM

## 2022-08-01 DIAGNOSIS — E1122 Type 2 diabetes mellitus with diabetic chronic kidney disease: Secondary | ICD-10-CM

## 2022-08-01 DIAGNOSIS — N182 Chronic kidney disease, stage 2 (mild): Secondary | ICD-10-CM

## 2022-08-01 DIAGNOSIS — Z6837 Body mass index (BMI) 37.0-37.9, adult: Secondary | ICD-10-CM

## 2022-08-01 DIAGNOSIS — Z6831 Body mass index (BMI) 31.0-31.9, adult: Secondary | ICD-10-CM | POA: Insufficient documentation

## 2022-08-01 DIAGNOSIS — Z0289 Encounter for other administrative examinations: Secondary | ICD-10-CM

## 2022-08-01 NOTE — Assessment & Plan Note (Signed)
Peak weight 250 pounds, associated comorbidities: Type 2 diabetes, hypertension, hyperlipidemia, venous insufficiency, vitamin D deficiency.  We reviewed weight, biometrics, associated medical conditions and contributing factors with patient. She would benefit from weight loss therapy via a modified calorie, low-carb, high-protein nutritional plan tailored to their REE (resting energy expenditure) which will be determined by indirect calorimetry.  We will also assess for cardiometabolic risk and nutritional derangements via fasting serologies at her next appointment.

## 2022-08-01 NOTE — Assessment & Plan Note (Signed)
Blood pressure is well-controlled.  She is currently on amlodipine 5 mg once a day.  We will check urine microalbumin if not done recently to determine if she benefits from ACE inhibitor.  Losing 10% of body weight may improve blood pressure control.

## 2022-08-01 NOTE — Assessment & Plan Note (Signed)
According to records and patient she had diabetes.  Her most recent A1c was 5.5.  She had done Optavia and has lost 60 pounds and is no longer on medications.  She still needs to be updated on diabetes preventive measures.  Losing 10% of body weight may reduce her risk of progression.  She also benefits from a low-carb nutrition plan.

## 2022-08-01 NOTE — Progress Notes (Signed)
Office: [phone]  /  Fax: [phone]   Initial Visit  Mia Oconnell was seen in clinic today to evaluate for obesity. She is interested in losing weight to improve overall health and reduce the risk of weight related complications. She presents today to review program treatment options, initial physical assessment, and evaluation.     She was referred by: PCP  When asked what else they would like to accomplish? She states: Improve energy levels and physical activity, Improve existing medical conditions, Reduce number of medications, Improve quality of life, and Improve self-confidence  Weight history: always felt overweight, tried several diets, 2021 did optavia and lost 60lbs   When asked how has your weight affected you? She states: Has affected self-esteem, Contributed to medical problems, Contributed to orthopedic problems or mobility issues, Having fatigue, and Having poor endurance  Some associated conditions: Hypertension, Hyperlipidemia, Diabetes, and Vitamin D Deficiency  Contributing factors: Family history, Stress, Reduced physical activity, and Menopause  Weight promoting medications identified: None  Current nutrition plan: None  Current level of physical activity: Other: does not enjoy exercise, walks most mornings [phone] steps  Current or previous pharmacotherapy: None, interested in medication  Response to medication: Never tried medications   Past medical history includes:   Past Medical History:  Diagnosis Date   Allergy    Anxiety    Diabetes mellitus without complication (HCC)    Hx of varicose vein stripping    Hyperlipidemia    Hypertension    Vitamin D deficiency      Objective:   BP 131/70   Pulse 73   Temp 98.6 F (37 C)   Ht 5\' 7"  (1.702 m)   Wt 241 lb (109.3 kg)   LMP 03/21/2022   SpO2 99%   BMI 37.75 kg/m  She was weighed on the bioimpedance scale: Body mass index is 37.75 kg/m.  Peak Weight:250 , Body Fat%:47, Visceral Fat  Rating:14, Weight trend over the last 12 months: Increasing  General:  Alert, oriented and cooperative. Patient is in no acute distress.  Respiratory: Normal respiratory effort, no problems with respiration noted   Gait: able to ambulate independently  Mental Status: Normal mood and affect. Normal behavior. Normal judgment and thought content.   DIAGNOSTIC DATA REVIEWED:  BMET    Component Value Date/Time   NA 137 06/13/2019 0855   K 4.2 06/13/2019 0855   CL 104 06/13/2019 0855   CO2 28 06/13/2019 0855   GLUCOSE 98 06/13/2019 0855   BUN 13 06/13/2019 0855   CREATININE 0.64 06/13/2019 0855   CALCIUM 9.4 06/13/2019 0855   Lab Results  Component Value Date   HGBA1C 5.5 09/16/2019   HGBA1C 6.0 10/18/2016   No results found for: "INSULIN" CBC    Component Value Date/Time   WBC 7.2 04/24/2019 0801   RBC 4.40 04/24/2019 0801   HGB 14.0 04/24/2019 0801   HCT 40.6 04/24/2019 0801   PLT 201.0 04/24/2019 0801   MCV 92.2 04/24/2019 0801   MCHC 34.5 04/24/2019 0801   RDW 13.3 04/24/2019 0801   Iron/TIBC/Ferritin/ %Sat No results found for: "IRON", "TIBC", "FERRITIN", "IRONPCTSAT" Lipid Panel     Component Value Date/Time   CHOL 105 04/24/2019 0801   TRIG 56.0 04/24/2019 0801   HDL 42.80 04/24/2019 0801   CHOLHDL 2 04/24/2019 0801   VLDL 11.2 04/24/2019 0801   LDLCALC 51 04/24/2019 0801   Hepatic Function Panel     Component Value Date/Time   PROT 6.9 04/24/2019 0801  ALBUMIN 4.4 04/24/2019 0801   AST 18 04/24/2019 0801   ALT 18 04/24/2019 0801   ALKPHOS 54 04/24/2019 0801   BILITOT 0.7 04/24/2019 0801      Component Value Date/Time   TSH 1.96 04/24/2019 0801     Assessment and Plan:   Type 2 diabetes mellitus with stage 2 chronic kidney disease, without long-term current use of insulin (HCC) Assessment & Plan: According to records and patient she had diabetes.  Her most recent A1c was 5.5.  She had done Optavia and has lost 60 pounds and is no longer on  medications.  She still needs to be updated on diabetes preventive measures.  Losing 10% of body weight may reduce her risk of progression.  She also benefits from a low-carb nutrition plan.   Essential hypertension Assessment & Plan: Blood pressure is well-controlled.  She is currently on amlodipine 5 mg once a day.  We will check urine microalbumin if not done recently to determine if she benefits from ACE inhibitor.  Losing 10% of body weight may improve blood pressure control.   Mixed hyperlipidemia Assessment & Plan: LDL is at goal.  She is currently on atorvastatin 40 mg a day which is moderate intensity and adequate for risk category. Lab Results  Component Value Date   CHOL 105 04/24/2019   HDL 42.80 04/24/2019   LDLCALC 51 04/24/2019   TRIG 56.0 04/24/2019   CHOLHDL 2 04/24/2019    Continue weight loss therapy, losing 10% or more of body weight may improve condition. Also advised to reduce saturated fats in diet to less than 10% of daily calories.        Class 2 severe obesity with serious comorbidity and body mass index (BMI) of 37.0 to 37.9 in adult, unspecified obesity type (HCC) Assessment & Plan: Peak weight 250 pounds, associated comorbidities: Type 2 diabetes, hypertension, hyperlipidemia, venous insufficiency, vitamin D deficiency.  We reviewed weight, biometrics, associated medical conditions and contributing factors with patient. She would benefit from weight loss therapy via a modified calorie, low-carb, high-protein nutritional plan tailored to their REE (resting energy expenditure) which will be determined by indirect calorimetry.  We will also assess for cardiometabolic risk and nutritional derangements via fasting serologies at her next appointment.         Obesity Treatment / Action Plan:  Patient will work on garnering support from family and friends to begin weight loss journey. Will work on eliminating or reducing the presence of highly palatable,  calorie dense foods in the home. Will complete provided nutritional and psychosocial assessment questionnaire before the next appointment. Will be scheduled for indirect calorimetry to determine resting energy expenditure in a fasting state.  This will allow [place] to create a reduced calorie, high-protein meal plan to promote loss of fat mass while preserving muscle mass. Counseled on the health benefits of losing 5%-15% of total body weight. Was counseled on nutritional approaches to weight loss and benefits of reducing processed foods and consuming plant-based foods and high quality protein as part of nutritional weight management. Was counseled on pharmacotherapy and role as an adjunct in weight management.   Obesity Education Performed Today:  She was weighed on the bioimpedance scale and results were discussed and documented in the synopsis.  We discussed obesity as a disease and the importance of a more detailed evaluation of all the factors contributing to the disease.  We discussed the importance of long term lifestyle changes which include nutrition, exercise and behavioral modifications as  well as the importance of customizing this to her specific health and social needs.  We discussed the benefits of reaching a healthier weight to alleviate the symptoms of existing conditions and reduce the risks of the biomechanical, metabolic and psychological effects of obesity.  Mia Oconnell appears to be in the action stage of change and states they are ready to start intensive lifestyle modifications and behavioral modifications.  30 minutes was spent today on this visit including the above counseling, pre-visit chart review, and post-visit documentation.  Reviewed by clinician on day of visit: allergies, medications, problem list, medical history, surgical history, family history, social history, and previous encounter notes pertinent to obesity diagnosis.   Worthy Rancher, MD

## 2022-08-01 NOTE — Assessment & Plan Note (Signed)
LDL is at goal.  She is currently on atorvastatin 40 mg a day which is moderate intensity and adequate for risk category. Lab Results  Component Value Date   CHOL 105 04/24/2019   HDL 42.80 04/24/2019   LDLCALC 51 04/24/2019   TRIG 56.0 04/24/2019   CHOLHDL 2 04/24/2019    Continue weight loss therapy, losing 10% or more of body weight may improve condition. Also advised to reduce saturated fats in diet to less than 10% of daily calories.

## 2022-08-11 ENCOUNTER — Other Ambulatory Visit: Payer: Commercial Managed Care - PPO

## 2022-08-17 ENCOUNTER — Encounter (INDEPENDENT_AMBULATORY_CARE_PROVIDER_SITE_OTHER): Payer: Self-pay | Admitting: Internal Medicine

## 2022-08-17 ENCOUNTER — Ambulatory Visit (INDEPENDENT_AMBULATORY_CARE_PROVIDER_SITE_OTHER): Payer: Commercial Managed Care - PPO | Admitting: Internal Medicine

## 2022-08-17 VITALS — BP 121/76 | HR 66 | Temp 97.8°F | Ht 67.0 in | Wt 238.0 lb

## 2022-08-17 DIAGNOSIS — E1122 Type 2 diabetes mellitus with diabetic chronic kidney disease: Secondary | ICD-10-CM | POA: Diagnosis not present

## 2022-08-17 DIAGNOSIS — N182 Chronic kidney disease, stage 2 (mild): Secondary | ICD-10-CM

## 2022-08-17 DIAGNOSIS — I10 Essential (primary) hypertension: Secondary | ICD-10-CM

## 2022-08-17 DIAGNOSIS — F32A Depression, unspecified: Secondary | ICD-10-CM

## 2022-08-17 DIAGNOSIS — R0602 Shortness of breath: Secondary | ICD-10-CM | POA: Diagnosis not present

## 2022-08-17 DIAGNOSIS — R9431 Abnormal electrocardiogram [ECG] [EKG]: Secondary | ICD-10-CM | POA: Diagnosis not present

## 2022-08-17 DIAGNOSIS — R5383 Other fatigue: Secondary | ICD-10-CM

## 2022-08-17 DIAGNOSIS — E782 Mixed hyperlipidemia: Secondary | ICD-10-CM | POA: Diagnosis not present

## 2022-08-17 DIAGNOSIS — Z6837 Body mass index (BMI) 37.0-37.9, adult: Secondary | ICD-10-CM

## 2022-08-17 DIAGNOSIS — Z1331 Encounter for screening for depression: Secondary | ICD-10-CM

## 2022-08-17 NOTE — Assessment & Plan Note (Signed)
According to records and patient she had diabetes.  Her most recent A1c was 5.5.  She had done Optavia and has lost 60 pounds and is no longer on medications.  She still needs to be updated on diabetes preventive measures please refer to orders.    Losing 10% of body weight and increasing physical activity may reduce her risk of progression.  We also reviewed the benefits of whole foods and a plant-based nutrition plan.

## 2022-08-17 NOTE — Assessment & Plan Note (Signed)
LDL is at goal.  She is currently on atorvastatin 40 mg a day which is moderate intensity and adequate for risk category. Lab Results  Component Value Date   CHOL 105 04/24/2019   HDL 42.80 04/24/2019   LDLCALC 51 04/24/2019   TRIG 56.0 04/24/2019   CHOLHDL 2 04/24/2019    Continue weight loss therapy, losing 10% or more of body weight may improve condition. Also advised to reduce saturated fats in diet to less than 10% of daily calories.  Rechecking a fasting lipid panel today.

## 2022-08-17 NOTE — Progress Notes (Signed)
Chief Complaint:   OBESITY Mia Oconnell (MR# [id number]) is a 57 y.o. female who presents for evaluation and treatment of obesity and related comorbidities. Current BMI is Body mass index is 37.28 kg/m. Mia Oconnell has been struggling with her weight for many years and has been unsuccessful in either losing weight, maintaining weight loss, or reaching her healthy weight goal.  Mia Oconnell is currently in the action stage of change and ready to dedicate time achieving and maintaining a healthier weight. Mia Oconnell is interested in becoming our patient and working on intensive lifestyle modifications including (but not limited to) diet and exercise for weight loss.  Mia Oconnell's habits were reviewed today and are as follows: Her family eats meals together, she thinks her family will eat healthier with her, her desired weight loss is 58 lbs, she has been heavy most of her life, her heaviest weight ever was 247 pounds, she is a picky eater and doesn't like to eat healthier foods, she has significant food cravings issues, she snacks frequently in the evenings, she skips meals frequently, she is frequently drinking liquids with calories, she frequently makes poor food choices, she frequently eats larger portions than normal, and she struggles with emotional eating.  Depression Screen Mia Oconnell's Food and Mood (modified PHQ-9) score was 6.  Subjective:   1. Other fatigue Mia Oconnell admits to daytime somnolence and admits to waking up still tired. Patient has a history of symptoms of daytime fatigue and morning fatigue. Mia Oconnell generally gets 6 or 7 hours of sleep per night, and states that she has nightime awakenings and generally restful sleep. Snoring is present. Apneic episodes are not present. Epworth Sleepiness Score is 15.   2. SOB (shortness of breath) on exertion Mia Oconnell notes increasing shortness of breath with exercising and seems to be worsening over time with weight gain. She notes getting  out of breath sooner with activity than she used to. This has not gotten worse recently. Mia Oconnell denies shortness of breath at rest or orthopnea.  3. Essential hypertension Blood pressure is well-controlled.  She is currently on amlodipine 5 mg once a day.  4. Type 2 diabetes mellitus with stage 2 chronic kidney disease, without long-term current use of insulin (HCC) According to records and patient she had diabetes.  Her most recent A1c was 5.5.  She had done Optavia and has lost 60 pounds and is no longer on medications.  She still needs to be updated on diabetes preventive measures please refer to orders.   5. Mixed hyperlipidemia LDL is at goal.  She is currently on atorvastatin 40 mg a day which is moderate intensity and adequate for risk category.  Lab Results  Component Value Date   CHOL 105 04/24/2019   HDL 42.80 04/24/2019   LDLCALC 51 04/24/2019   TRIG 56.0 04/24/2019   CHOLHDL 2 04/24/2019   6. Abnormal ECG She has an RSR and LAE, we will repeat ECG and screen for OSA at next visit.  Assessment/Plan:   1. Other fatigue Mia Oconnell does feel that her weight is causing her energy to be lower than it should be. Fatigue may be related to obesity, depression or many other causes. Labs will be ordered, and in the meanwhile, Mia Oconnell will focus on self care including making healthy food choices, increasing physical activity and focusing on stress reduction.  - EKG 12-Lead - CBC With Differential - Vitamin B12  2. SOB (shortness of breath) on exertion Mia Oconnell does feel that she gets out of breath  more easily that she used to when she exercises. Mia Oconnell's shortness of breath appears to be obesity related and exercise induced. She has agreed to work on weight loss and gradually increase exercise to treat her exercise induced shortness of breath. Will continue to monitor closely.  3. Essential hypertension We will check urine microalbumin to determine if she benefits from ACE  inhibitor.  Losing 10% of body weight may improve blood pressure control.  She will also monitor for orthostasis while losing weight.  - CMP14+EGFR  4. Type 2 diabetes mellitus with stage 2 chronic kidney disease, without long-term current use of insulin (HCC) Losing 10% of body weight and increasing physical activity may reduce her risk of progression.  We also reviewed the benefits of whole foods and a plant-based nutrition plan.  - Hemoglobin A1c - Insulin, random - Microalbumin / creatinine urine ratio  5. Mixed hyperlipidemia Continue weight loss therapy, losing 10% or more of body weight may improve condition. Also advised to reduce saturated fats in diet to less than 10% of daily calories.  Rechecking a fasting lipid panel today.  - Lipid Panel With LDL/HDL Ratio - TSH  6. Abnormal ECG She has an RSR and LAE, we will repeat ECG and screen for OSA at next visit.  7. Depression screening Mia Oconnell had a positive depression screening. Depression is commonly associated with obesity and often results in emotional eating behaviors. We will monitor this closely and work on CBT to help improve the non-hunger eating patterns. Referral to Psychology may be required if no improvement is seen as she continues in our clinic.  8. Class 2 severe obesity with serious comorbidity and body mass index (BMI) of 37.0 to 37.9 in adult, unspecified obesity type (HCC) - VITAMIN D 25 Hydroxy (Vit-D Deficiency, Fractures)  Mia Oconnell is currently in the action stage of change and her goal is to continue with weight loss efforts. I recommend Mia Oconnell begin the structured treatment plan as follows:  She has agreed to the Category 2 Plan and keeping a food journal and adhering to recommended goals of 1200 calories and 90 grams of protein daily.  Exercise goals: All adults should avoid inactivity. Some physical activity is better than none, and adults who participate in any amount of physical activity gain some  health benefits.   Behavioral modification strategies: increasing lean protein intake, decreasing simple carbohydrates, increasing vegetables, increasing water intake, decreasing liquid calories, increasing high fiber foods, decreasing eating out, no skipping meals, meal planning and cooking strategies, better snacking choices, and planning for success.  She was informed of the importance of frequent follow-up visits to maximize her success with intensive lifestyle modifications for her multiple health conditions. She was informed we would discuss her lab results at her next visit unless there is a critical issue that needs to be addressed sooner. Mia Oconnell agreed to keep her next visit at the agreed upon time to discuss these results.  Objective:   Blood pressure 121/76, pulse 66, temperature 97.8 F (36.6 C), height 5\' 7"  (1.702 m), weight 238 lb (108 kg), last menstrual period 03/21/2022, SpO2 96 %. Body mass index is 37.28 kg/m.  EKG: Normal sinus rhythm, rate 61 BPM.  Indirect Calorimeter completed today shows a VO2 of 245 and a REE of 1685.  Her calculated basal metabolic rate is 1610 thus her basal metabolic rate is worse than expected.  General: Cooperative, alert, well developed, in no acute distress. HEENT: Conjunctivae and lids unremarkable. Cardiovascular: Regular rhythm.  Lungs: Normal  work of breathing. Neurologic: No focal deficits.   Lab Results  Component Value Date   CREATININE 0.63 08/17/2022   BUN 16 08/17/2022   NA 140 08/17/2022   K 4.1 08/17/2022   CL 102 08/17/2022   CO2 22 08/17/2022   Lab Results  Component Value Date   ALT 16 08/17/2022   AST 19 08/17/2022   ALKPHOS 74 08/17/2022   BILITOT 0.6 08/17/2022   Lab Results  Component Value Date   HGBA1C 6.1 (H) 08/17/2022   HGBA1C 5.5 09/16/2019   HGBA1C 6.3 04/24/2019   HGBA1C 6.6 (H) 06/06/2018   HGBA1C 6.3 11/09/2017   Lab Results  Component Value Date   INSULIN 6.4 08/17/2022   Lab Results   Component Value Date   TSH 2.130 08/17/2022   Lab Results  Component Value Date   CHOL 161 08/17/2022   HDL 73 08/17/2022   LDLCALC 74 08/17/2022   TRIG 75 08/17/2022   CHOLHDL 2 04/24/2019   Lab Results  Component Value Date   WBC 7.6 08/17/2022   HGB 13.8 08/17/2022   HCT 40.7 08/17/2022   MCV 93 08/17/2022   PLT 201.0 04/24/2019   No results found for: "IRON", "TIBC", "FERRITIN"  Attestation Statements:   Reviewed by clinician on day of visit: allergies, medications, problem list, medical history, surgical history, family history, social history, and previous encounter notes.  Time spent on visit including pre-visit chart review and post-visit charting and care was 40 minutes.   Trude Mcburney, am acting as transcriptionist for Worthy Rancher, MD.  I have reviewed the above documentation for accuracy and completeness, and I agree with the above. -Worthy Rancher, MD

## 2022-08-17 NOTE — Assessment & Plan Note (Signed)
Blood pressure is well-controlled.  She is currently on amlodipine 5 mg once a day.  We will check urine microalbumin to determine if she benefits from ACE inhibitor.  Losing 10% of body weight may improve blood pressure control.  She will also monitor for orthostasis while losing weight.

## 2022-08-17 NOTE — Assessment & Plan Note (Signed)
She has an RSR and LAE, we will repeat ECG and screen for OSA at next visit.

## 2022-08-18 LAB — CMP14+EGFR
ALT: 16 IU/L (ref 0–32)
AST: 19 IU/L (ref 0–40)
Albumin/Globulin Ratio: 2 (ref 1.2–2.2)
Albumin: 4.5 g/dL (ref 3.8–4.9)
Alkaline Phosphatase: 74 IU/L (ref 44–121)
BUN/Creatinine Ratio: 25 — ABNORMAL HIGH (ref 9–23)
BUN: 16 mg/dL (ref 6–24)
Bilirubin Total: 0.6 mg/dL (ref 0.0–1.2)
CO2: 22 mmol/L (ref 20–29)
Calcium: 9.2 mg/dL (ref 8.7–10.2)
Chloride: 102 mmol/L (ref 96–106)
Creatinine, Ser: 0.63 mg/dL (ref 0.57–1.00)
Globulin, Total: 2.3 g/dL (ref 1.5–4.5)
Glucose: 115 mg/dL — ABNORMAL HIGH (ref 70–99)
Potassium: 4.1 mmol/L (ref 3.5–5.2)
Sodium: 140 mmol/L (ref 134–144)
Total Protein: 6.8 g/dL (ref 6.0–8.5)
eGFR: 105 mL/min/{1.73_m2} (ref >59)

## 2022-08-18 LAB — CBC WITH DIFFERENTIAL
Basophils Absolute: 0.1 10*3/uL (ref 0.0–0.2)
Basos: 1 %
EOS (ABSOLUTE): 0.1 10*3/uL (ref 0.0–0.4)
Eos: 1 %
Hematocrit: 40.7 % (ref 34.0–46.6)
Hemoglobin: 13.8 g/dL (ref 11.1–15.9)
Immature Grans (Abs): 0 10*3/uL (ref 0.0–0.1)
Immature Granulocytes: 0 %
Lymphocytes Absolute: 2.2 10*3/uL (ref 0.7–3.1)
Lymphs: 29 %
MCH: 31.5 pg (ref 26.6–33.0)
MCHC: 33.9 g/dL (ref 31.5–35.7)
MCV: 93 fL (ref 79–97)
Monocytes Absolute: 0.7 10*3/uL (ref 0.1–0.9)
Monocytes: 9 %
Neutrophils Absolute: 4.6 10*3/uL (ref 1.4–7.0)
Neutrophils: 60 %
RBC: 4.38 x10E6/uL (ref 3.77–5.28)
RDW: 12.3 % (ref 11.7–15.4)
WBC: 7.6 10*3/uL (ref 3.4–10.8)

## 2022-08-18 LAB — MICROALBUMIN / CREATININE URINE RATIO
Creatinine, Urine: 81.6 mg/dL
Microalb/Creat Ratio: 5 mg/g creat (ref 0–29)
Microalbumin, Urine: 4 ug/mL

## 2022-08-18 LAB — LIPID PANEL WITH LDL/HDL RATIO
Cholesterol, Total: 161 mg/dL (ref 100–199)
HDL: 73 mg/dL (ref >39)
LDL Chol Calc (NIH): 74 mg/dL (ref 0–99)
LDL/HDL Ratio: 1 ratio (ref 0.0–3.2)
Triglycerides: 75 mg/dL (ref 0–149)
VLDL Cholesterol Cal: 14 mg/dL (ref 5–40)

## 2022-08-18 LAB — HEMOGLOBIN A1C
Est. average glucose Bld gHb Est-mCnc: 128 mg/dL
Hgb A1c MFr Bld: 6.1 % — ABNORMAL HIGH (ref 4.8–5.6)

## 2022-08-18 LAB — TSH: TSH: 2.13 u[IU]/mL (ref 0.450–4.500)

## 2022-08-18 LAB — VITAMIN D 25 HYDROXY (VIT D DEFICIENCY, FRACTURES): Vit D, 25-Hydroxy: 38.1 ng/mL (ref 30.0–100.0)

## 2022-08-18 LAB — VITAMIN B12: Vitamin B-12: 655 pg/mL (ref 232–1245)

## 2022-08-18 LAB — INSULIN, RANDOM: INSULIN: 6.4 u[IU]/mL (ref 2.6–24.9)

## 2022-08-30 ENCOUNTER — Encounter: Payer: Self-pay | Admitting: Gastroenterology

## 2022-08-31 ENCOUNTER — Ambulatory Visit (INDEPENDENT_AMBULATORY_CARE_PROVIDER_SITE_OTHER): Payer: Commercial Managed Care - PPO | Admitting: Internal Medicine

## 2022-08-31 ENCOUNTER — Encounter (INDEPENDENT_AMBULATORY_CARE_PROVIDER_SITE_OTHER): Payer: Self-pay | Admitting: Internal Medicine

## 2022-08-31 ENCOUNTER — Encounter: Payer: Self-pay | Admitting: Family

## 2022-08-31 VITALS — BP 120/72 | HR 63 | Temp 97.9°F | Ht 67.0 in | Wt 236.0 lb

## 2022-08-31 DIAGNOSIS — E782 Mixed hyperlipidemia: Secondary | ICD-10-CM

## 2022-08-31 DIAGNOSIS — E1169 Type 2 diabetes mellitus with other specified complication: Secondary | ICD-10-CM

## 2022-08-31 DIAGNOSIS — I10 Essential (primary) hypertension: Secondary | ICD-10-CM

## 2022-08-31 DIAGNOSIS — R9431 Abnormal electrocardiogram [ECG] [EKG]: Secondary | ICD-10-CM

## 2022-08-31 DIAGNOSIS — Z6836 Body mass index (BMI) 36.0-36.9, adult: Secondary | ICD-10-CM

## 2022-08-31 DIAGNOSIS — Z299 Encounter for prophylactic measures, unspecified: Secondary | ICD-10-CM

## 2022-08-31 DIAGNOSIS — Z6837 Body mass index (BMI) 37.0-37.9, adult: Secondary | ICD-10-CM

## 2022-08-31 MED ORDER — METFORMIN HCL ER 500 MG PO TB24
500.0000 mg | ORAL_TABLET | Freq: Every day | ORAL | 0 refills | Status: DC
Start: 2022-08-31 — End: 2022-09-28

## 2022-08-31 NOTE — Assessment & Plan Note (Signed)
Repeat EKG today does not show RSR and LAE, screening for OSA was negative other than a Mallampati of 4.  Neck circumference was 15 inches

## 2022-08-31 NOTE — Assessment & Plan Note (Signed)
She was diagnosed with diabetes in 2020 when she had a hemoglobin A1c of 6.6.  She had done Optavia lost weight and had reversed condition.  She was also on metformin at the time which was discontinued because of patient preference.  Her hemoglobin A1c is now 6.1 previously was 5.5 so increasing.  In addition to nutritional strategies and increasing physical activity after discussion of benefits and risk she will be started on metformin XR 500 mg 1 tablet daily.

## 2022-08-31 NOTE — Assessment & Plan Note (Signed)
Blood pressure is well-controlled.  On amlodipine without any adverse effects.  She will continue medication.  She will monitor for orthostasis while losing weight.

## 2022-08-31 NOTE — Progress Notes (Signed)
Office: [phone]  /  Fax: [phone]  WEIGHT SUMMARY AND BIOMETRICS  Vitals Temp: 97.9 F (36.6 C) BP: 120/72 Pulse Rate: 63 SpO2: 98 %   Anthropometric Measurements Height: 5\' 7"  (1.702 m) Weight: 236 lb (107 kg) BMI (Calculated): 36.95 Weight at Last Visit: 238 lb Weight Lost Since Last Visit: 2 lb Starting Weight: 238 lb Total Weight Loss (lbs): 2 lb (0.907 kg) Peak Weight: 247 lb   Body Composition  Body Fat %: 46.4 % Fat Mass (lbs): 109.6 lbs Muscle Mass (lbs): 120.4 lbs Total Body Water (lbs): 88.6 lbs Visceral Fat Rating : 13    No data recorded Today's Visit #: 2  Starting Date: 08/17/22   HPI  Chief Complaint: OBESITY  Mia Oconnell is here to discuss her progress with her obesity treatment plan. She is on the the Category 2 Plan and states she is following her eating plan approximately 0 % of the time. She states she is exercising 30 minutes 4 times per week.  Interval History:  Since last office visit she has lost 2 pounds.  She has not implemented plan as she had been out of the country in [place] and arrived on Monday.  She is eager to start implementing nutritional approach. Denies problems with appetite and hunger signals.  Denies problems with satiety and satiation.  Denies problems with eating patterns and portion control.  Denies abnormal cravings. Denies feeling deprived or restricted.   Barriers identified: none.   Pharmacotherapy for weight loss: She is currently taking no anti-obesity medication.    ASSESSMENT AND PLAN  TREATMENT PLAN FOR OBESITY:  Recommended Dietary Goals  Mia Oconnell is currently in the action stage of change. As such, her goal is to continue weight management plan. She has agreed to: continue current plan  Behavioral Intervention  We discussed the following Behavioral Modification Strategies today: increasing lean protein intake, decreasing simple carbohydrates , increasing vegetables, increasing lower  glycemic fruits, increasing fiber rich foods, increasing water intake, continue to work on implementation of reduced calorie nutritional plan, continue to practice mindfulness when eating, and planning for success.  Additional resources provided today: Handout on increasing daily activity and exercise goal setting  Recommended Physical Activity Goals  [name] has been advised to work up to 150 minutes of moderate intensity aerobic activity a week and strengthening exercises 2-3 times per week for cardiovascular health, weight loss maintenance and preservation of muscle mass.   She has agreed to :  Think about ways to increase daily physical activity and overcoming barriers to exercise, Start strengthening exercises with a goal of 2-3 sessions a week , and Start aerobic activity with a goal of 150 minutes a week at moderate intensity.   Pharmacotherapy We discussed various medication options to help Mia Oconnell with her weight loss efforts and we both agreed to : continue with nutritional and behavioral strategies  ASSOCIATED CONDITIONS ADDRESSED TODAY  Type 2 diabetes mellitus with other specified complication, without long-term current use of insulin [facility]) Assessment & Plan: She was diagnosed with diabetes in 2020 when she had a hemoglobin A1c of 6.6.  She had done Optavia lost weight and had reversed condition.  She was also on metformin at the time which was discontinued because of patient preference.  Her hemoglobin A1c is now 6.1 previously was 5.5 so increasing.  In addition to nutritional strategies and increasing physical activity after discussion of benefits and risk she will be started on metformin XR 500 mg 1 tablet daily.  Orders: -  metFORMIN HCl ER; Take 1 tablet (500 mg total) by mouth daily with breakfast.  Dispense: 30 tablet; Refill: 0  Mixed hyperlipidemia Assessment & Plan: LDL is at goal.  She is on atorvastatin without any adverse effects.  Elevated LDL may be  secondary to nutrition, genetics and spillover effect from excess adiposity. Recommended LDL goal is <70 to reduce the risk of fatty streaks and the progression to obstructive ASCVD in the future. Her 10 year risk is: The 10-year ASCVD risk score (Arnett DK, et al., 2019) is: 2.8%  Lab Results  Component Value Date   CHOL 161 08/17/2022   HDL 73 08/17/2022   LDLCALC 74 08/17/2022   TRIG 75 08/17/2022   CHOLHDL 2 04/24/2019    Continue weight loss therapy, losing 10% or more of body weight may improve condition. Also advised to reduce saturated fats in diet to less than 10% of daily calories.  Continue statin therapy at current dose      Essential hypertension Assessment & Plan: Blood pressure is well-controlled.  On amlodipine without any adverse effects.  She will continue medication.  She will monitor for orthostasis while losing weight.   Class 2 severe obesity with serious comorbidity and body mass index (BMI) of 37.0 to 37.9 in adult, unspecified obesity type (HCC)  Abnormal ECG Assessment & Plan: Repeat EKG today does not show RSR and LAE, screening for OSA was negative other than a Mallampati of 4.  Neck circumference was 15 inches  Orders: -     EKG 12-Lead  Preventive measure Assessment & Plan: Patient is was counseled cancer preventive measures.  She is scheduling her Pap is up-to-date with colonoscopy and mammography.  Her cardiovascular risk was estimated to be 2.8%.  Her blood pressure and cholesterol are optimized.     PHYSICAL EXAM:  Blood pressure 120/72, pulse 63, temperature 97.9 F (36.6 C), height 5\' 7"  (1.702 m), weight 236 lb (107 kg), last menstrual period 07/20/2022, SpO2 98 %. Body mass index is 36.96 kg/m.  General: She is overweight, cooperative, alert, well developed, and in no acute distress. PSYCH: Has normal mood, affect and thought process.   HEENT: EOMI, sclerae are anicteric. Lungs: Normal breathing effort, no conversational  dyspnea. Extremities: No edema.  Neurologic: No gross sensory or motor deficits. No tremors or fasciculations noted.    DIAGNOSTIC DATA REVIEWED:  BMET    Component Value Date/Time   NA 140 08/17/2022 0839   K 4.1 08/17/2022 0839   CL 102 08/17/2022 0839   CO2 22 08/17/2022 0839   GLUCOSE 115 (H) 08/17/2022 0839   GLUCOSE 98 06/13/2019 0855   BUN 16 08/17/2022 0839   CREATININE 0.63 08/17/2022 0839   CALCIUM 9.2 08/17/2022 0839   Lab Results  Component Value Date   HGBA1C 6.1 (H) 08/17/2022   HGBA1C 6.0 10/18/2016   Lab Results  Component Value Date   INSULIN 6.4 08/17/2022   Lab Results  Component Value Date   TSH 2.130 08/17/2022   CBC    Component Value Date/Time   WBC 7.6 08/17/2022 0839   WBC 7.2 04/24/2019 0801   RBC 4.38 08/17/2022 0839   RBC 4.40 04/24/2019 0801   HGB 13.8 08/17/2022 0839   HCT 40.7 08/17/2022 0839   PLT 201.0 04/24/2019 0801   MCV 93 08/17/2022 0839   MCH 31.5 08/17/2022 0839   MCHC 33.9 08/17/2022 0839   MCHC 34.5 04/24/2019 0801   RDW 12.3 08/17/2022 0839   Iron Studies No results  found for: "IRON", "TIBC", "FERRITIN", "IRONPCTSAT" Lipid Panel     Component Value Date/Time   CHOL 161 08/17/2022 0839   TRIG 75 08/17/2022 0839   HDL 73 08/17/2022 0839   CHOLHDL 2 04/24/2019 0801   VLDL 11.2 04/24/2019 0801   LDLCALC 74 08/17/2022 0839   Hepatic Function Panel     Component Value Date/Time   PROT 6.8 08/17/2022 0839   ALBUMIN 4.5 08/17/2022 0839   AST 19 08/17/2022 0839   ALT 16 08/17/2022 0839   ALKPHOS 74 08/17/2022 0839   BILITOT 0.6 08/17/2022 0839      Component Value Date/Time   TSH 2.130 08/17/2022 0839   Nutritional Lab Results  Component Value Date   VD25OH 38.1 08/17/2022   VD25OH 54.44 04/24/2019   VD25OH 28.15 (L) 11/09/2017     Return in about 2 weeks (around 09/14/2022) for For Weight Mangement with Dr. Rikki Spearing.Marland Kitchen She was informed of the importance of frequent follow up visits to maximize her  success with intensive lifestyle modifications for her multiple health conditions.   ATTESTASTION STATEMENTS:  Reviewed by clinician on day of visit: allergies, medications, problem list, medical history, surgical history, family history, social history, and previous encounter notes.     Worthy Rancher, MD

## 2022-08-31 NOTE — Assessment & Plan Note (Signed)
LDL is at goal.  She is on atorvastatin without any adverse effects.  Elevated LDL may be secondary to nutrition, genetics and spillover effect from excess adiposity. Recommended LDL goal is <70 to reduce the risk of fatty streaks and the progression to obstructive ASCVD in the future. Her 10 year risk is: The 10-year ASCVD risk score (Arnett DK, et al., 2019) is: 2.8%  Lab Results  Component Value Date   CHOL 161 08/17/2022   HDL 73 08/17/2022   LDLCALC 74 08/17/2022   TRIG 75 08/17/2022   CHOLHDL 2 04/24/2019    Continue weight loss therapy, losing 10% or more of body weight may improve condition. Also advised to reduce saturated fats in diet to less than 10% of daily calories.  Continue statin therapy at current dose

## 2022-08-31 NOTE — Assessment & Plan Note (Signed)
Patient is was counseled cancer preventive measures.  She is scheduling her Pap is up-to-date with colonoscopy and mammography.  Her cardiovascular risk was estimated to be 2.8%.  Her blood pressure and cholesterol are optimized.

## 2022-09-05 ENCOUNTER — Ambulatory Visit: Payer: Commercial Managed Care - PPO | Admitting: Anesthesiology

## 2022-09-05 ENCOUNTER — Encounter
Admission: RE | Disposition: A | Discharge status: Home / Self Care | Payer: Self-pay | Source: Home / Self Care | Attending: Gastroenterology

## 2022-09-05 ENCOUNTER — Ambulatory Visit
Admission: RE | Admit: 2022-09-05 | Discharge: 2022-09-05 | Disposition: A | Discharge status: Home / Self Care | Payer: Commercial Managed Care - PPO | Attending: Gastroenterology | Admitting: Gastroenterology

## 2022-09-05 ENCOUNTER — Encounter: Payer: Self-pay | Admitting: Gastroenterology

## 2022-09-05 DIAGNOSIS — Z800 Family history of malignant neoplasm of digestive organs: Secondary | ICD-10-CM | POA: Diagnosis not present

## 2022-09-05 DIAGNOSIS — Z1211 Encounter for screening for malignant neoplasm of colon: Secondary | ICD-10-CM | POA: Diagnosis present

## 2022-09-05 DIAGNOSIS — Z860100 Personal history of colon polyps, unspecified: Secondary | ICD-10-CM

## 2022-09-05 HISTORY — DX: Prediabetes: R73.03

## 2022-09-05 HISTORY — PX: COLONOSCOPY WITH PROPOFOL: SHX5780

## 2022-09-05 LAB — POCT PREGNANCY, URINE: Preg Test, Ur: NEGATIVE

## 2022-09-05 SURGERY — COLONOSCOPY WITH PROPOFOL
Anesthesia: General

## 2022-09-05 MED ORDER — MIDAZOLAM HCL 2 MG/2ML IJ SOLN
INTRAMUSCULAR | Status: DC | PRN
  Administered 2022-09-05: 2 mg via INTRAVENOUS

## 2022-09-05 MED ORDER — PROPOFOL 500 MG/50ML IV EMUL
INTRAVENOUS | Status: DC | PRN
  Administered 2022-09-05: 165 ug/kg/min via INTRAVENOUS

## 2022-09-05 MED ORDER — SODIUM CHLORIDE 0.9 % IV SOLN
INTRAVENOUS | Status: DC
  Administered 2022-09-05: 1000 mL via INTRAVENOUS

## 2022-09-05 MED ORDER — PHENYLEPHRINE 80 MCG/ML (10ML) SYRINGE FOR IV PUSH (FOR BLOOD PRESSURE SUPPORT)
PREFILLED_SYRINGE | INTRAVENOUS | Status: DC | PRN
  Administered 2022-09-05 (×2): 160 ug via INTRAVENOUS

## 2022-09-05 MED ORDER — PROPOFOL 1000 MG/100ML IV EMUL
INTRAVENOUS | Status: AC
  Filled 2022-09-05: qty 500

## 2022-09-05 MED ORDER — PROPOFOL 10 MG/ML IV BOLUS
INTRAVENOUS | Status: DC | PRN
  Administered 2022-09-05: 50 mg via INTRAVENOUS
  Administered 2022-09-05: 20 mg via INTRAVENOUS

## 2022-09-05 MED ORDER — MIDAZOLAM HCL 2 MG/2ML IJ SOLN
INTRAMUSCULAR | Status: AC
  Filled 2022-09-05: qty 2

## 2022-09-05 MED ORDER — LIDOCAINE HCL (CARDIAC) PF 100 MG/5ML IV SOSY
PREFILLED_SYRINGE | INTRAVENOUS | Status: DC | PRN
  Administered 2022-09-05: 100 mg via INTRAVENOUS

## 2022-09-05 NOTE — Op Note (Signed)
[facility] Patient Name: Mia Oconnell Procedure Date: 09/05/2022 7:09 AM MRN: [id number] Account #: [id number] Date of Birth: 21-Feb-1966 Admit Type: Outpatient Age: 57 Room: [facility] ENDO ROOM 4 Gender: Female Note Status: Finalized Instrument Name: Prentice Docker [id number] Procedure:             Colonoscopy Indications:           Surveillance: Personal history of adenomatous polyps                         on last colonoscopy 5 years ago, Last colonoscopy:                         June 2019 Providers:             Toney Reil MD, MD Referring MD:          Toney Reil MD, MD (Referring MD), Lyn Records.                         Arnett (Referring MD) Medicines:             General Anesthesia Complications:         No immediate complications. Estimated blood loss: None. Procedure:             Pre-Anesthesia Assessment:                        - Prior to the procedure, a History and Physical was                         performed, and patient medications and allergies were                         reviewed. The patient is competent. The risks and                         benefits of the procedure and the sedation options and                         risks were discussed with the patient. All questions                         were answered and informed consent was obtained.                         Patient identification and proposed procedure were                         verified by the physician, the nurse, the                         anesthesiologist, the anesthetist and the technician                         in the pre-procedure area in the procedure room in the                         endoscopy suite. Mental Status Examination: alert and  oriented. Airway Examination: normal oropharyngeal                         airway and neck mobility. Respiratory Examination:                         clear to auscultation. CV Examination:  normal.                         Prophylactic Antibiotics: The patient does not require                         prophylactic antibiotics. Prior Anticoagulants: The                         patient has taken no anticoagulant or antiplatelet                         agents. ASA Grade Assessment: II - A patient with mild                         systemic disease. After reviewing the risks and                         benefits, the patient was deemed in satisfactory                         condition to undergo the procedure. The anesthesia                         plan was to use general anesthesia. Immediately prior                         to administration of medications, the patient was                         re-assessed for adequacy to receive sedatives. The                         heart rate, respiratory rate, oxygen saturations,                         blood pressure, adequacy of pulmonary ventilation, and                         response to care were monitored throughout the                         procedure. The physical status of the patient was                         re-assessed after the procedure.                        After obtaining informed consent, the colonoscope was                         passed under direct vision. Throughout the procedure,  the patient's blood pressure, pulse, and oxygen                         saturations were monitored continuously. The                         Colonoscope was introduced through the anus and                         advanced to the the cecum, identified by appendiceal                         orifice and ileocecal valve. The colonoscopy was                         performed with moderate difficulty due to significant                         looping and the patient's body habitus. Successful                         completion of the procedure was aided by applying                         abdominal pressure. The patient  tolerated the                         procedure well. The quality of the bowel preparation                         was evaluated using the BBPS [facility] Bowel Preparation                         Scale) with scores of: Right Colon = 3, Transverse                         Colon = 3 and Left Colon = 3 (entire mucosa seen well                         with no residual staining, small fragments of stool or                         opaque liquid). The total BBPS score equals 9. The                         ileocecal valve, appendiceal orifice, and rectum were                         photographed. The ileocecal valve, appendiceal                         orifice, and rectum were photographed. Findings:      The perianal and digital rectal examinations were normal. Pertinent       negatives include normal sphincter tone and no palpable rectal lesions.      The entire examined colon appeared normal.      The retroflexed view of the distal rectum and anal verge  was normal and       showed no anal or rectal abnormalities. Impression:            - The entire examined colon is normal.                        - The distal rectum and anal verge are normal on                         retroflexion view.                        - No specimens collected. Recommendation:        - Discharge patient to home (with escort).                        - Resume previous diet today.                        - Continue present medications.                        - Repeat colonoscopy in 10 years for screening                         purposes. Procedure Code(s):     --- Professional ---                        A2130, Colorectal cancer screening; colonoscopy on                         individual at high risk Diagnosis Code(s):     --- Professional ---                        Z86.010, Personal history of colonic polyps CPT copyright 2022 [employer]. All rights reserved. The codes documented in this report are  preliminary and upon coder review may  be revised to meet current compliance requirements. Dr. Libby Maw Toney Reil MD, MD 09/05/2022 8:11:36 AM This report has been signed electronically. Number of Addenda: 0 Note Initiated On: 09/05/2022 7:09 AM Scope Withdrawal Time: 0 hours 8 minutes 34 seconds  Total Procedure Duration: 0 hours 21 minutes 56 seconds  Estimated Blood Loss:  Estimated blood loss: none.      [facility]

## 2022-09-05 NOTE — Anesthesia Procedure Notes (Signed)
Procedure Name: General with mask airway Date/Time: 09/05/2022 7:43 AM  Performed by: Mohammed Kindle, CRNAPatient Re-evaluated:Patient Re-evaluated prior to induction Oxygen Delivery Method: Simple face mask Induction Type: IV induction Placement Confirmation: positive ETCO2, CO2 detector and breath sounds checked- equal and bilateral Dental Injury: Teeth and Oropharynx as per pre-operative assessment

## 2022-09-05 NOTE — Anesthesia Preprocedure Evaluation (Addendum)
Anesthesia Evaluation  Patient identified by MRN, date of birth, ID band Patient awake    Reviewed: Allergy & Precautions, NPO status , Patient's Chart, lab work & pertinent test results  History of Anesthesia Complications Negative for: history of anesthetic complications  Airway Mallampati: I   Neck ROM: Full    Dental no notable dental hx.    Pulmonary neg pulmonary ROS   Pulmonary exam normal breath sounds clear to auscultation       Cardiovascular hypertension, Normal cardiovascular exam Rhythm:Regular Rate:Normal  ECG 08/31/22: Sinus Bradycardia  WITHIN NORMAL LIMITS   Neuro/Psych  PSYCHIATRIC DISORDERS Anxiety     negative neurological ROS     GI/Hepatic negative GI ROS,,,  Endo/Other  diabetes, Type 2  Obesity   Renal/GU negative Renal ROS     Musculoskeletal   Abdominal   Peds  Hematology negative hematology ROS (+)   Anesthesia Other Findings   Reproductive/Obstetrics                             Anesthesia Physical Anesthesia Plan  ASA: 2  Anesthesia Plan: General   Post-op Pain Management:    Induction: Intravenous  PONV Risk Score and Plan: 3 and Propofol infusion, TIVA and Treatment may vary due to age or medical condition  Airway Management Planned: Natural Airway  Additional Equipment:   Intra-op Plan:   Post-operative Plan:   Informed Consent: I have reviewed the patients History and Physical, chart, labs and discussed the procedure including the risks, benefits and alternatives for the proposed anesthesia with the patient or authorized representative who has indicated his/her understanding and acceptance.       Plan Discussed with: CRNA  Anesthesia Plan Comments: (LMA/GETA backup discussed.  Patient consented for risks of anesthesia including but not limited to:  - adverse reactions to medications - damage to eyes, teeth, lips or other oral mucosa -  nerve damage due to positioning  - sore throat or hoarseness - damage to heart, brain, nerves, lungs, other parts of body or loss of life  Informed patient about role of CRNA in peri- and intra-operative care.  Patient voiced understanding.)        Anesthesia Quick Evaluation

## 2022-09-05 NOTE — H&P (Signed)
Arlyss Repress, MD [address]  [address]  [place], [place] [address]  Main: [phone]  Fax: [phone] Pager: [phone]  Primary Care Physician:  Allegra Grana, FNP Primary Gastroenterologist:  Dr. Arlyss Repress  Pre-Procedure History & Physical: HPI:  Mia Oconnell is a 57 y.o. female is here for an colonoscopy.   Past Medical History:  Diagnosis Date   Allergy    Anxiety    Constipation    Hx of varicose vein stripping    Hyperlipidemia    Hypertension    Joint pain    Pre-diabetes    Skin cancer    Vitamin D deficiency     Past Surgical History:  Procedure Laterality Date   BREAST SURGERY     COLONOSCOPY WITH PROPOFOL N/A 08/28/2017   Procedure: COLONOSCOPY WITH PROPOFOL;  Surgeon: Toney Reil, MD;  Location: [facility] ENDOSCOPY;  Service: Gastroenterology;  Laterality: N/A;   fracture arm Left    Left radial fracture- 09/2021 , [facility]   FRACTURE SURGERY     SCLERAL BUCKLE     SKIN CANCER EXCISION N/A    05/2022, chest, 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 Authorizing Provider  amLODipine (NORVASC) 5 MG tablet TAKE 1 TABLET BY MOUTH EVERY DAY 07/26/22  Yes Allegra Grana, FNP  atorvastatin (LIPITOR) 40 MG tablet TAKE 1 TABLET BY MOUTH EVERY DAY AT 6 PM 07/07/22  Yes Allegra Grana, FNP  sertraline (ZOLOFT) 100 MG tablet TAKE 1 TABLET BY MOUTH EVERYDAY AT BEDTIME 07/26/22  Yes Allegra Grana, FNP  metFORMIN (GLUCOPHAGE-XR) 500 MG 24 hr tablet Take 1 tablet (500 mg total) by mouth daily with breakfast. 08/31/22   Worthy Rancher, MD    Allergies as of 07/21/2022 - Review Complete 07/18/2022  Allergen Reaction Noted   Lisinopril  10/18/2016   Penicillins  10/24/2016   Sulfa antibiotics  10/24/2016    Family History  Problem Relation Age of Onset   Stroke Mother    Hyperlipidemia Mother    Hypertension  Mother    Basal cell carcinoma Mother 45   Alzheimer's disease Mother    Anxiety disorder Mother    Obesity Mother    Obesity Father    Hyperlipidemia Father    Hypertension Father    Hyperlipidemia Maternal Grandmother    Hypertension Maternal Grandmother    Hyperlipidemia Maternal Grandfather    Heart disease Maternal Grandfather    Hypertension Maternal Grandfather    Hyperlipidemia Paternal Grandmother    Hypertension Paternal Grandmother    Cancer Paternal Grandfather        colon   Hyperlipidemia Paternal Grandfather    Hypertension Paternal Grandfather    Alcohol abuse Paternal Uncle     Social History   Socioeconomic History   Marital status: Married    Spouse name: Not on file   Number of children: Not on file   Years of education: Not on file   Highest education level: Master's degree (e.g., MA, MS, MEng, MEd, MSW, MBA)  Occupational History   Not on file  Tobacco Use   Smoking status: Never   Smokeless tobacco: Never  Vaping Use   Vaping Use: Never used  Substance and Sexual Activity   Alcohol use: No   Drug use: No   Sexual activity: Yes    Birth control/protection: Pill  Other Topics  Concern   Not on file  Social History Narrative   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      Social Determinants of Health   Financial Resource Strain: Low Risk  (07/15/2022)   Overall Financial Resource Strain (CARDIA)    Difficulty of Paying Living Expenses: Not hard at all  Food Insecurity: No Food Insecurity (07/15/2022)   Hunger Vital Sign    Worried About Running Out of Food in the Last Year: Never true    Ran Out of Food in the Last Year: Never true  Transportation Needs: No Transportation Needs (07/15/2022)   PRAPARE - Administrator, Civil Service (Medical): No    Lack of Transportation (Non-Medical): No  Physical Activity: Sufficiently Active (07/15/2022)   Exercise Vital Sign    Days of Exercise per Week: 5 days    Minutes of Exercise per  Session: 30 min  Stress: No Stress Concern Present (07/15/2022)   [employer] of Occupational Health - Occupational Stress Questionnaire    Feeling of Stress : Only a little  Social Connections: Socially Integrated (07/15/2022)   Social Connection and Isolation Panel [NHANES]    Frequency of Communication with Friends and Family: More than three times a week    Frequency of Social Gatherings with Friends and Family: Three times a week    Attends Religious Services: More than 4 times per year    Active Member of Clubs or Organizations: Yes    Attends Engineer, structural: More than 4 times per year    Marital Status: Married  Catering manager Violence: Not on file    Review of Systems: See HPI, otherwise negative ROS  Physical Exam: BP 127/66   Pulse 62   Temp (!) 96.9 F (36.1 C) (Temporal)   Resp 18   Ht 5\' 7"  (1.702 m)   Wt 107.4 kg   LMP 08/22/2022 Comment: Pregnancy test negative  SpO2 99%   BMI 37.07 kg/m  General:   Alert,  pleasant and cooperative in NAD Head:  Normocephalic and atraumatic. Neck:  Supple; no masses or thyromegaly. Lungs:  Clear throughout to auscultation.    Heart:  Regular rate and rhythm. Abdomen:  Soft, nontender and nondistended. Normal bowel sounds, without guarding, and without rebound.   Neurologic:  Alert and  oriented x4;  grossly normal neurologically.  Impression/Plan: Mia Oconnell is here for an colonoscopy to be performed for h/o colon adenoma  Risks, benefits, limitations, and alternatives regarding  colonoscopy have been reviewed with the patient.  Questions have been answered.  All parties agreeable.   Lannette Donath, MD  09/05/2022, 7:33 AM

## 2022-09-05 NOTE — Anesthesia Postprocedure Evaluation (Signed)
Anesthesia Post Note  Patient: Mia Oconnell  Procedure(s) Performed: COLONOSCOPY WITH PROPOFOL  Patient location during evaluation: PACU Anesthesia Type: General Level of consciousness: awake and alert, oriented and patient cooperative Pain management: pain level controlled Vital Signs Assessment: post-procedure vital signs reviewed and stable Respiratory status: spontaneous breathing, nonlabored ventilation and respiratory function stable Cardiovascular status: blood pressure returned to baseline and stable Postop Assessment: adequate PO intake Anesthetic complications: no   No notable events documented.   Last Vitals:  Vitals:   09/05/22 0831 09/05/22 0841  BP: 117/73 123/77  Pulse: 60 (!) 57  Resp: 13 12  Temp:    SpO2: 100% 100%    Last Pain:  Vitals:   09/05/22 0841  TempSrc:   PainSc: 0-No pain                 Reed Breech

## 2022-09-05 NOTE — Progress Notes (Signed)
PCP: Allegra Grana, FNP   Chief Complaint  Patient presents with   Gynecologic Exam    No concerns    HPI:      Ms. Mia Oconnell is a 57 y.o. [id number] who LMP was Patient's last menstrual period was 08/22/2022 (approximate)., presents today for her annual examination.  Her menses are infrequent now due to perimenopause, every few months, lasting 5-6 days, mod to heavy flow, no clots, no BTB, no dysmen. No longer on POPs. Does have vasomotor sx.   Sex activity: single partner, contraception - none. She does not have vaginal dryness/pain/bleeding.  Last Pap: 11/05/18  Results were: no abnormalities /neg HPV DNA.  Hx of STDs: none  Last mammogram: 07/21/22 at [facility], Results: no abnormalities, repeat in 1 yr.  There is no FH of breast cancer. There is no FH of ovarian cancer. The patient do self-breast exams.  Colonoscopy: 2024 at [facility] without polyps; repeat due after 10 yrs;  6/19 with polyp; repeat due after 5 yrs.   Tobacco use: The patient denies current or previous tobacco use. Alcohol use: social Drug use: none Exercise: moderately active  She does get adequate calcium and Vitamin D in her diet.  Labs/med problems managed by PCP now.  Doing wt loss at [facility] wt loss clinic.   Past Medical History:  Diagnosis Date   Allergy    Anxiety    Constipation    Hx of varicose vein stripping    Hyperlipidemia    Hypertension    Joint pain    Pre-diabetes    Skin cancer    Vitamin D deficiency     Past Surgical History:  Procedure Laterality Date   BREAST SURGERY     COLONOSCOPY WITH PROPOFOL N/A 08/28/2017   Procedure: COLONOSCOPY WITH PROPOFOL;  Surgeon: Toney Reil, MD;  Location: [facility] ENDOSCOPY;  Service: Gastroenterology;  Laterality: N/A;   COLONOSCOPY WITH PROPOFOL N/A 09/05/2022   Procedure: COLONOSCOPY WITH PROPOFOL;  Surgeon: Toney Reil, MD;  Location: [facility] ENDOSCOPY;  Service: Gastroenterology;  Laterality: N/A;   fracture arm Left     Left radial fracture- 09/2021 , [facility]   FRACTURE SURGERY     SCLERAL BUCKLE     SKIN CANCER EXCISION N/A    05/2022, chest, 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; squamous cell carcinoma in situ   VARICOSE VEIN SURGERY     WRIST SURGERY     10/06/2021    Family History  Problem Relation Age of Onset   Stroke Mother    Hyperlipidemia Mother    Hypertension Mother    Basal cell carcinoma Mother 40   Alzheimer's disease Mother    Anxiety disorder Mother    Obesity Mother    Obesity Father    Hyperlipidemia Father    Hypertension Father    Hyperlipidemia Maternal Grandmother    Hypertension Maternal Grandmother    Hyperlipidemia Maternal Grandfather    Heart disease Maternal Grandfather    Hypertension Maternal Grandfather    Hyperlipidemia Paternal Grandmother    Hypertension Paternal Grandmother    Cancer Paternal Grandfather        colon   Hyperlipidemia Paternal Grandfather    Hypertension Paternal Grandfather    Alcohol abuse Paternal Uncle     Social History   Socioeconomic History   Marital status: Married    Spouse name: Not on file   Number of children: Not on file   Years of education: Not on file   Highest education level: Master's  degree (e.g., MA, MS, MEng, MEd, MSW, MBA)  Occupational History   Not on file  Tobacco Use   Smoking status: Never   Smokeless tobacco: Never  Vaping Use   Vaping Use: Never used  Substance and Sexual Activity   Alcohol use: No   Drug use: No   Sexual activity: Yes    Birth control/protection: None  Other Topics Concern   Not on file  Social History Narrative   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      Social Determinants of Health   Financial Resource Strain: Low Risk  (07/15/2022)   Overall Financial Resource Strain (CARDIA)    Difficulty of Paying Living Expenses: Not hard at all  Food Insecurity: No Food Insecurity (07/15/2022)   Hunger Vital Sign    Worried About Running Out of Food in the Last Year: Never true    Ran Out of  Food in the Last Year: Never true  Transportation Needs: No Transportation Needs (07/15/2022)   PRAPARE - Administrator, Civil Service (Medical): No    Lack of Transportation (Non-Medical): No  Physical Activity: Sufficiently Active (07/15/2022)   Exercise Vital Sign    Days of Exercise per Week: 5 days    Minutes of Exercise per Session: 30 min  Stress: No Stress Concern Present (07/15/2022)   [employer] of Occupational Health - Occupational Stress Questionnaire    Feeling of Stress : Only a little  Social Connections: Socially Integrated (07/15/2022)   Social Connection and Isolation Panel [NHANES]    Frequency of Communication with Friends and Family: More than three times a week    Frequency of Social Gatherings with Friends and Family: Three times a week    Attends Religious Services: More than 4 times per year    Active Member of Clubs or Organizations: Yes    Attends Engineer, structural: More than 4 times per year    Marital Status: Married  Catering manager Violence: Not on file    Outpatient Medications Prior to Visit  Medication Sig Dispense Refill   amLODipine (NORVASC) 5 MG tablet TAKE 1 TABLET BY MOUTH EVERY DAY 90 tablet 3   atorvastatin (LIPITOR) 40 MG tablet TAKE 1 TABLET BY MOUTH EVERY DAY AT 6 PM 90 tablet 3   Cholecalciferol (VITAMIN D) 125 MCG (5000 UT) CAPS      levocetirizine (XYZAL ALLERGY 24HR) 5 MG tablet      Multiple Vitamins-Minerals (CENTRUM SILVER 50+WOMEN) TABS      Omega-3 Fatty Acids (FISH OIL) 1000 MG CAPS      sertraline (ZOLOFT) 100 MG tablet TAKE 1 TABLET BY MOUTH EVERYDAY AT BEDTIME 90 tablet 1   metFORMIN (GLUCOPHAGE-XR) 500 MG 24 hr tablet Take 1 tablet (500 mg total) by mouth daily with breakfast. (Patient not taking: Reported on 09/06/2022) 30 tablet 0   No facility-administered medications prior to visit.       ROS:  Review of Systems  Constitutional:  Negative for fatigue, fever and unexpected weight  change.  Respiratory:  Negative for cough, shortness of breath and wheezing.   Cardiovascular:  Negative for chest pain, palpitations and leg swelling.  Gastrointestinal:  Negative for blood in stool, constipation, diarrhea, nausea and vomiting.  Endocrine: Negative for cold intolerance, heat intolerance and polyuria.  Genitourinary:  Negative for dyspareunia, dysuria, flank pain, frequency, genital sores, hematuria, menstrual problem, pelvic pain, urgency, vaginal bleeding, vaginal discharge and vaginal pain.  Musculoskeletal:  Negative for back pain,  joint swelling and myalgias.  Skin:  Negative for rash.  Neurological:  Negative for dizziness, syncope, light-headedness, numbness and headaches.  Hematological:  Negative for adenopathy.  Psychiatric/Behavioral:  Negative for agitation, confusion, sleep disturbance and suicidal ideas. The patient is not nervous/anxious.    BREAST: No symptoms    Objective: BP 120/70   Ht 5\' 7"  (1.702 m)   Wt 240 lb (108.9 kg)   LMP 08/22/2022 (Approximate)   BMI 37.59 kg/m    Physical Exam Constitutional:      Appearance: She is well-developed.  Genitourinary:     Vulva normal.     Right Labia: No rash, tenderness or lesions.    Left Labia: No tenderness, lesions or rash.    No vaginal discharge, erythema or tenderness.      Right Adnexa: not tender and no mass present.    Left Adnexa: not tender and no mass present.    No cervical friability or polyp.     Uterus is not enlarged or tender.  Breasts:    Right: No mass, nipple discharge, skin change or tenderness.     Left: No mass, nipple discharge, skin change or tenderness.  Neck:     Thyroid: No thyromegaly.  Cardiovascular:     Rate and Rhythm: Normal rate and regular rhythm.     Heart sounds: Normal heart sounds. No murmur heard. Pulmonary:     Effort: Pulmonary effort is normal.     Breath sounds: Normal breath sounds.  Abdominal:     Palpations: Abdomen is soft.      Tenderness: There is no abdominal tenderness. There is no guarding or rebound.  Musculoskeletal:        General: Normal range of motion.     Cervical back: Normal range of motion.  Lymphadenopathy:     Cervical: No cervical adenopathy.  Neurological:     General: No focal deficit present.     Mental Status: She is alert and oriented to person, place, and time.     Cranial Nerves: No cranial nerve deficit.  Skin:    General: Skin is warm and dry.  Psychiatric:        Mood and Affect: Mood normal.        Behavior: Behavior normal.        Thought Content: Thought content normal.        Judgment: Judgment normal.  Vitals reviewed.    Assessment/Plan: Encounter for annual routine gynecological examination  Cervical cancer screening - Plan: Cytology - PAP  Screening for HPV (human papillomavirus) - Plan: Cytology - PAP  Encounter for screening mammogram for malignant neoplasm of breast; pt current on mammo  Perimenopause--f/u prn AUB.         GYN counsel breast self exam, mammography screening, menopause, adequate intake of calcium and vitamin D, diet and exercise    F/U  Return in about 1 year (around 09/06/2023) for annual.  Mia Oconnell B. Oniel Meleski, PA-C 09/06/2022 3:46 PM

## 2022-09-05 NOTE — Transfer of Care (Signed)
Immediate Anesthesia Transfer of Care Note  Patient: Jennye Bleyer  Procedure(s) Performed: COLONOSCOPY WITH PROPOFOL  Patient Location: Endoscopy Unit  Anesthesia Type:General  Level of Consciousness: drowsy and patient cooperative  Airway & Oxygen Therapy: Patient Spontanous Breathing and Patient connected to face mask oxygen  Post-op Assessment: Report given to RN and Post -op Vital signs reviewed and stable  Post vital signs: Reviewed and stable  Last Vitals:  Vitals Value Taken Time  BP 103/54 09/05/22 0814  Temp    Pulse 59 09/05/22 0814  Resp 15 09/05/22 0814  SpO2 100 % 09/05/22 0814    Last Pain:  Vitals:   09/05/22 0814  TempSrc:   PainSc: Asleep         Complications: No notable events documented.

## 2022-09-06 ENCOUNTER — Other Ambulatory Visit (HOSPITAL_COMMUNITY)
Admission: RE | Admit: 2022-09-06 | Discharge: 2022-09-06 | Disposition: A | Discharge status: Home / Self Care | Payer: Commercial Managed Care - PPO | Source: Ambulatory Visit | Attending: Obstetrics and Gynecology | Admitting: Obstetrics and Gynecology

## 2022-09-06 ENCOUNTER — Ambulatory Visit (INDEPENDENT_AMBULATORY_CARE_PROVIDER_SITE_OTHER): Payer: Commercial Managed Care - PPO | Admitting: Obstetrics and Gynecology

## 2022-09-06 ENCOUNTER — Encounter: Payer: Self-pay | Admitting: Gastroenterology

## 2022-09-06 VITALS — BP 120/70 | Ht 67.0 in | Wt 240.0 lb

## 2022-09-06 DIAGNOSIS — Z124 Encounter for screening for malignant neoplasm of cervix: Secondary | ICD-10-CM | POA: Insufficient documentation

## 2022-09-06 DIAGNOSIS — Z01419 Encounter for gynecological examination (general) (routine) without abnormal findings: Secondary | ICD-10-CM

## 2022-09-06 DIAGNOSIS — Z1151 Encounter for screening for human papillomavirus (HPV): Secondary | ICD-10-CM | POA: Diagnosis present

## 2022-09-06 DIAGNOSIS — Z1231 Encounter for screening mammogram for malignant neoplasm of breast: Secondary | ICD-10-CM

## 2022-09-06 DIAGNOSIS — N951 Menopausal and female climacteric states: Secondary | ICD-10-CM

## 2022-09-06 NOTE — Patient Instructions (Signed)
I value your feedback and you entrusting us with your care. If you get a [facility] patient survey, I would appreciate you taking the time to let us know about your experience today. Thank you! ? ? ?

## 2022-09-08 LAB — CYTOLOGY - PAP
Comment: NEGATIVE
Diagnosis: NEGATIVE
High risk HPV: NEGATIVE

## 2022-09-14 ENCOUNTER — Encounter (INDEPENDENT_AMBULATORY_CARE_PROVIDER_SITE_OTHER): Payer: Commercial Managed Care - PPO | Admitting: Family Medicine

## 2022-09-19 ENCOUNTER — Ambulatory Visit (INDEPENDENT_AMBULATORY_CARE_PROVIDER_SITE_OTHER): Payer: Commercial Managed Care - PPO | Admitting: Internal Medicine

## 2022-09-24 ENCOUNTER — Other Ambulatory Visit (INDEPENDENT_AMBULATORY_CARE_PROVIDER_SITE_OTHER): Payer: Self-pay | Admitting: Internal Medicine

## 2022-09-24 DIAGNOSIS — E1169 Type 2 diabetes mellitus with other specified complication: Secondary | ICD-10-CM

## 2022-09-28 ENCOUNTER — Other Ambulatory Visit (INDEPENDENT_AMBULATORY_CARE_PROVIDER_SITE_OTHER): Payer: Self-pay | Admitting: Internal Medicine

## 2022-09-28 DIAGNOSIS — E1169 Type 2 diabetes mellitus with other specified complication: Secondary | ICD-10-CM

## 2022-09-28 MED ORDER — METFORMIN HCL ER 500 MG PO TB24
500.0000 mg | ORAL_TABLET | Freq: Every day | ORAL | 0 refills | Status: DC
Start: 2022-09-28 — End: 2022-11-01

## 2022-10-10 ENCOUNTER — Ambulatory Visit (INDEPENDENT_AMBULATORY_CARE_PROVIDER_SITE_OTHER): Payer: Commercial Managed Care - PPO | Admitting: Internal Medicine

## 2022-10-10 ENCOUNTER — Other Ambulatory Visit (INDEPENDENT_AMBULATORY_CARE_PROVIDER_SITE_OTHER): Payer: Self-pay | Admitting: Internal Medicine

## 2022-10-10 ENCOUNTER — Encounter (INDEPENDENT_AMBULATORY_CARE_PROVIDER_SITE_OTHER): Payer: Self-pay | Admitting: Internal Medicine

## 2022-10-10 VITALS — BP 127/70 | HR 65 | Temp 98.4°F | Ht 67.0 in | Wt 239.0 lb

## 2022-10-10 DIAGNOSIS — E1169 Type 2 diabetes mellitus with other specified complication: Secondary | ICD-10-CM

## 2022-10-10 DIAGNOSIS — Z7985 Long-term (current) use of injectable non-insulin antidiabetic drugs: Secondary | ICD-10-CM | POA: Diagnosis not present

## 2022-10-10 DIAGNOSIS — Z6837 Body mass index (BMI) 37.0-37.9, adult: Secondary | ICD-10-CM

## 2022-10-10 MED ORDER — TIRZEPATIDE 2.5 MG/0.5ML ~~LOC~~ SOAJ
2.5000 mg | SUBCUTANEOUS | 0 refills | Status: DC
Start: 2022-10-10 — End: 2022-11-23

## 2022-10-10 NOTE — Progress Notes (Signed)
Office: [phone]  /  Fax: [phone]  WEIGHT SUMMARY AND BIOMETRICS  Vitals Temp: 98.4 F (36.9 C) BP: 127/70 Pulse Rate: 65 SpO2: 98 %   Anthropometric Measurements Height: 5\' 7"  (1.702 m) Weight: 239 lb (108.4 kg) BMI (Calculated): 37.42 Weight at Last Visit: 236 lb Weight Lost Since Last Visit: 0 lb Weight Gained Since Last Visit: 3 lb Starting Weight: 238 lb Total Weight Loss (lbs): 0 lb (0 kg) Peak Weight: 247 lb   Body Composition  Body Fat %: 45.6 % Fat Mass (lbs): 109.2 lbs Muscle Mass (lbs): 123.6 lbs Total Body Water (lbs): 90 lbs Visceral Fat Rating : 13    No data recorded Today's Visit #: 3  Starting Date: 08/17/22   HPI  Chief Complaint: OBESITY  Mia Oconnell is here to discuss her progress with her obesity treatment plan. She is on the the Category 2 Plan and states she is following her eating plan approximately 60 % of the time. She states she is exercising 30 minutes 3 times per week.  Interval History:  Since last office visit she has gained 3 pounds. She reports fair adherence to reduced calorie nutritional plan. She has been working on not skipping meals, increasing protein intake at every meal, drinking more water, and continues to exercise  Orixegenic Control: Reports problems with appetite and hunger signals.  Reports problems with satiety and satiation.  Denies problems with eating patterns and portion control.  Reports abnormal cravings. Denies feeling deprived or restricted.   Barriers identified: strong hunger signals and appetite, having difficulty preparing healthy meals, having difficulty with meal prep and planning, having difficulty focusing on healthy eating, medical comorbidities, and difficulty maintaining a reduced calorie state.   Pharmacotherapy for weight loss: She is currently taking no anti-obesity medication.    ASSESSMENT AND PLAN  TREATMENT PLAN FOR OBESITY:  Recommended Dietary Goals  Mia Oconnell is  currently in the action stage of change. As such, her goal is to continue weight management plan. She has agreed to: continue current plan  Behavioral Intervention  We discussed the following Behavioral Modification Strategies today: increasing lean protein intake, decreasing simple carbohydrates , increasing vegetables, increasing lower glycemic fruits, increasing fiber rich foods, increasing water intake, and planning for success.  Additional resources provided today: None  Recommended Physical Activity Goals  Mia Oconnell has been advised to work up to 150 minutes of moderate intensity aerobic activity a week and strengthening exercises 2-3 times per week for cardiovascular health, weight loss maintenance and preservation of muscle mass.   She has agreed to :  Think about ways to increase daily physical activity and overcoming barriers to exercise and Increase physical activity in their day and reduce sedentary time (increase NEAT).  Pharmacotherapy We discussed various medication options to help Mia Oconnell with her weight loss efforts and we both agreed to : start anti-obesity medication.  In addition to reduced calorie nutrition plan (RCNP), behavioral strategies and physical activity, Mia Oconnell would benefit from pharmacotherapy to assist with glycemic control, hunger signals, satiety and cravings. This will reduce obesity-related health risks by inducing weight loss, and help reduce food consumption and adherence to [facility]) . It may also improve QOL by improving self-confidence and reduce the  setbacks associated with metabolic adaptations.  After discussion of treatment options, mechanisms of action, benefits, side effects, contraindications and shared decision making she is agreeable to starting tirzepatide 2.5 mg once a week with the primary indication of type 2 diabetes. Patient also made aware that medication is  indicated for long-term management of diabetes and obesity and the risk of weight  regain following discontinuation of treatment and hence the importance of adhering to medical weight loss plan.  We demonstrated use of device and patient using teach back method was able to demonstrate proper technique.  ASSOCIATED CONDITIONS ADDRESSED TODAY  Class 2 severe obesity with serious comorbidity and body mass index (BMI) of 37.0 to 37.9 in adult, unspecified obesity type (HCC) Assessment & Plan: Peak weight 250 pounds, associated comorbidities: Type 2 diabetes, hypertension, hyperlipidemia, venous insufficiency, vitamin D deficiency.  Patient has strong orixegenic signaling which makes it difficult for her to maintain a reduced calorie state.  She benefits from incretin therapy.  She will be started on tirzepatide 2.5 mg once a week  Orders: -     Tirzepatide; Inject 2.5 mg into the skin once a week.  Dispense: 2 mL; Refill: 0  Type 2 diabetes mellitus with other specified complication, without long-term current use of insulin (HCC) Assessment & Plan: She was diagnosed with diabetes in 2020 when she had a hemoglobin A1c of 6.6.  She had done Optavia lost weight but has regained. Her hemoglobin A1c is now 6.1 previously was 5.5 so increasing.  She is not on metformin 500 mg XR 1 tablet daily without any adverse effects.  We discussed the benefits of incretin therapy and she is interested in [place].  This medication will help patient with her diabetes and assist in weight management which will help reduce obesity-related comorbidities.  We will discontinue metformin due to patient preference.  Orders: -     Tirzepatide; Inject 2.5 mg into the skin once a week.  Dispense: 2 mL; Refill: 0    PHYSICAL EXAM:  Blood pressure 127/70, pulse 65, temperature 98.4 F (36.9 C), height 5\' 7"  (1.702 m), weight 239 lb (108.4 kg), SpO2 98%. Body mass index is 37.43 kg/m.  General: She is overweight, cooperative, alert, well developed, and in no acute distress. PSYCH: Has normal mood, affect  and thought process.   HEENT: EOMI, sclerae are anicteric. Lungs: Normal breathing effort, no conversational dyspnea. Extremities: No edema.  Neurologic: No gross sensory or motor deficits. No tremors or fasciculations noted.    DIAGNOSTIC DATA REVIEWED:  BMET    Component Value Date/Time   NA 140 08/17/2022 0839   K 4.1 08/17/2022 0839   CL 102 08/17/2022 0839   CO2 22 08/17/2022 0839   GLUCOSE 115 (H) 08/17/2022 0839   GLUCOSE 98 06/13/2019 0855   BUN 16 08/17/2022 0839   CREATININE 0.63 08/17/2022 0839   CALCIUM 9.2 08/17/2022 0839   Lab Results  Component Value Date   HGBA1C 6.1 (H) 08/17/2022   HGBA1C 6.0 10/18/2016   Lab Results  Component Value Date   INSULIN 6.4 08/17/2022   Lab Results  Component Value Date   TSH 2.130 08/17/2022   CBC    Component Value Date/Time   WBC 7.6 08/17/2022 0839   WBC 7.2 04/24/2019 0801   RBC 4.38 08/17/2022 0839   RBC 4.40 04/24/2019 0801   HGB 13.8 08/17/2022 0839   HCT 40.7 08/17/2022 0839   PLT 201.0 04/24/2019 0801   MCV 93 08/17/2022 0839   MCH 31.5 08/17/2022 0839   MCHC 33.9 08/17/2022 0839   MCHC 34.5 04/24/2019 0801   RDW 12.3 08/17/2022 0839   Iron Studies No results found for: "IRON", "TIBC", "FERRITIN", "IRONPCTSAT" Lipid Panel     Component Value Date/Time   CHOL 161 08/17/2022 0839  TRIG 75 08/17/2022 0839   HDL 73 08/17/2022 0839   CHOLHDL 2 04/24/2019 0801   VLDL 11.2 04/24/2019 0801   LDLCALC 74 08/17/2022 0839   Hepatic Function Panel     Component Value Date/Time   PROT 6.8 08/17/2022 0839   ALBUMIN 4.5 08/17/2022 0839   AST 19 08/17/2022 0839   ALT 16 08/17/2022 0839   ALKPHOS 74 08/17/2022 0839   BILITOT 0.6 08/17/2022 0839      Component Value Date/Time   TSH 2.130 08/17/2022 0839   Nutritional Lab Results  Component Value Date   VD25OH 38.1 08/17/2022   VD25OH 54.44 04/24/2019   VD25OH 28.15 (L) 11/09/2017     Return in about 3 weeks (around 10/31/2022) for For Weight  Mangement with Dr. Rikki Spearing.Marland Kitchen She was informed of the importance of frequent follow up visits to maximize her success with intensive lifestyle modifications for her multiple health conditions.   ATTESTASTION STATEMENTS:  Reviewed by clinician on day of visit: allergies, medications, problem list, medical history, surgical history, family history, social history, and previous encounter notes.     Worthy Rancher, MD

## 2022-10-10 NOTE — Assessment & Plan Note (Addendum)
She was diagnosed with diabetes in 2020 when she had a hemoglobin A1c of 6.6.  She had done Optavia lost weight but has regained. Her hemoglobin A1c is now 6.1 previously was 5.5 so increasing.  She is not on metformin 500 mg XR 1 tablet daily without any adverse effects.  We discussed the benefits of incretin therapy and she is interested in [place].  This medication will help patient with her diabetes and assist in weight management which will help reduce obesity-related comorbidities.  We will discontinue metformin due to patient preference.

## 2022-10-10 NOTE — Assessment & Plan Note (Signed)
Peak weight 250 pounds, associated comorbidities: Type 2 diabetes, hypertension, hyperlipidemia, venous insufficiency, vitamin D deficiency.  Patient has strong orixegenic signaling which makes it difficult for her to maintain a reduced calorie state.  She benefits from incretin therapy.  She will be started on tirzepatide 2.5 mg once a week

## 2022-10-11 ENCOUNTER — Telehealth (INDEPENDENT_AMBULATORY_CARE_PROVIDER_SITE_OTHER): Payer: Self-pay

## 2022-10-11 NOTE — Telephone Encounter (Signed)
PA started for [employer] 

## 2022-10-12 ENCOUNTER — Ambulatory Visit (INDEPENDENT_AMBULATORY_CARE_PROVIDER_SITE_OTHER): Payer: Commercial Managed Care - PPO

## 2022-10-12 ENCOUNTER — Ambulatory Visit (INDEPENDENT_AMBULATORY_CARE_PROVIDER_SITE_OTHER): Payer: Commercial Managed Care - PPO | Admitting: Podiatry

## 2022-10-12 ENCOUNTER — Encounter: Payer: Self-pay | Admitting: Podiatry

## 2022-10-12 ENCOUNTER — Encounter (INDEPENDENT_AMBULATORY_CARE_PROVIDER_SITE_OTHER): Payer: Self-pay | Admitting: Internal Medicine

## 2022-10-12 DIAGNOSIS — M722 Plantar fascial fibromatosis: Secondary | ICD-10-CM | POA: Diagnosis not present

## 2022-10-12 MED ORDER — MELOXICAM 15 MG PO TABS
15.0000 mg | ORAL_TABLET | Freq: Every day | ORAL | 3 refills | Status: DC
Start: 2022-10-12 — End: 2022-12-28

## 2022-10-12 NOTE — Progress Notes (Signed)
Subjective:  Patient ID: Mia Oconnell, female    DOB: January 04, 1966,  MRN: [id number] HPI Chief Complaint  Patient presents with   Plantar Fasciitis    Patient came in today for left foot heel pain, started 4 months ago, rate of pain 7 out of 10 while walking, patient has been taking Ibuprofen and resting and icing to help with the pain, X-Rays taken today     57 y.o. female presents with the above complaint.   ROS: Denies fever chills nausea vomit muscle aches pains calf pain back pain chest pain shortness of breath.  Past Medical History:  Diagnosis Date   Allergy    Anxiety    Constipation    Hx of varicose vein stripping    Hyperlipidemia    Hypertension    Joint pain    Pre-diabetes    Skin cancer    Vitamin D deficiency    Past Surgical History:  Procedure Laterality Date   BREAST SURGERY     COLONOSCOPY WITH PROPOFOL N/A 08/28/2017   Procedure: COLONOSCOPY WITH PROPOFOL;  Surgeon: Toney Reil, MD;  Location: [facility] ENDOSCOPY;  Service: Gastroenterology;  Laterality: N/A;   COLONOSCOPY WITH PROPOFOL N/A 09/05/2022   Procedure: COLONOSCOPY WITH PROPOFOL;  Surgeon: Toney Reil, MD;  Location: [facility] ENDOSCOPY;  Service: Gastroenterology;  Laterality: N/A;   fracture arm Left    Left radial fracture- 09/2021 , [facility]   FRACTURE SURGERY     SCLERAL BUCKLE     SKIN CANCER EXCISION N/A    05/2022, chest, 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; squamous cell carcinoma in situ   VARICOSE VEIN SURGERY     WRIST SURGERY     10/06/2021    Current Outpatient Medications:    meloxicam (MOBIC) 15 MG tablet, Take 1 tablet (15 mg total) by mouth daily., Disp: 30 tablet, Rfl: 3   amLODipine (NORVASC) 5 MG tablet, TAKE 1 TABLET BY MOUTH EVERY DAY, Disp: 90 tablet, Rfl: 3   atorvastatin (LIPITOR) 40 MG tablet, TAKE 1 TABLET BY MOUTH EVERY DAY AT 6 PM, Disp: 90 tablet, Rfl: 3   Cholecalciferol (VITAMIN D) 125 MCG (5000 UT) CAPS, , Disp: , Rfl:    levocetirizine (XYZAL ALLERGY 24HR) 5 MG  tablet, , Disp: , Rfl:    metFORMIN (GLUCOPHAGE-XR) 500 MG 24 hr tablet, Take 1 tablet (500 mg total) by mouth daily with breakfast., Disp: 30 tablet, Rfl: 0   Multiple Vitamins-Minerals (CENTRUM SILVER 50+WOMEN) TABS, , Disp: , Rfl:    Omega-3 Fatty Acids (FISH OIL) 1000 MG CAPS, , Disp: , Rfl:    sertraline (ZOLOFT) 100 MG tablet, TAKE 1 TABLET BY MOUTH EVERYDAY AT BEDTIME, Disp: 90 tablet, Rfl: 1   tirzepatide (MOUNJARO) 2.5 MG/0.5ML Pen, Inject 2.5 mg into the skin once a week., Disp: 2 mL, Rfl: 0  Allergies  Allergen Reactions   Lisinopril     cough   Penicillins    Sulfa Antibiotics    Review of Systems Objective:  There were no vitals filed for this visit.  General: Well developed, nourished, in no acute distress, alert and oriented x3   Dermatological: Skin is warm, dry and supple bilateral. Nails x 10 are well maintained; remaining integument appears unremarkable at this time. There are no open sores, no preulcerative lesions, no rash or signs of infection present.  Vascular: Dorsalis Pedis artery and Posterior Tibial artery pedal pulses are 2/4 bilateral with immedate capillary fill time. Pedal hair growth present. No varicosities and no lower extremity edema present  bilateral.   Neruologic: Grossly intact via light touch bilateral. Vibratory intact via tuning fork bilateral. Protective threshold with Semmes Wienstein monofilament intact to all pedal sites bilateral. Patellar and Achilles deep tendon reflexes 2+ bilateral. No Babinski or clonus noted bilateral.   Musculoskeletal: No gross boney pedal deformities bilateral. No pain, crepitus, or limitation noted with foot and ankle range of motion bilateral. Muscular strength 5/5 in all groups tested bilateral.  Pain on palpation medial calcaneal tubercle and plantar calcaneal tubercle with a small nodule consistent with a plantar calcaneal bursa.  Gait: Unassisted, Nonantalgic.    Radiographs:  Radiographs taken today  demonstrate osseously mature individual soft tissue increase in density of plantar fascial kidney insertion site of the left heel consistent with Planter fasciitis no acute findings are identified.  Assessment & Plan:   Assessment: Planter fasciitis.  Plan: Discussed etiology pathology and surgical therapies started her on meloxicam and injected her left heel today.  This was injected 20 mg Kenalog 5 mg Marcaine.  Tolerated procedure well.  She has a night splint that she will wear at home and we also dispensed a plantar fascial brace.  Discussed appropriate shoe gear stretching exercise ice therapy sugar modifications.     Jakyia Gaccione T. [place], [place]

## 2022-10-12 NOTE — Patient Instructions (Signed)
Plantar Fasciitis Rehab Ask your health care provider which exercises are safe for you. Do exercises exactly as told by your health care provider and adjust them as directed. It is normal to feel mild stretching, pulling, tightness, or discomfort as you do these exercises. Stop right away if you feel sudden pain or your pain gets worse. Do not begin these exercises until told by your health care provider. Stretching and range-of-motion exercises These exercises warm up your muscles and joints and improve the movement and flexibility of your foot. These exercises also help to relieve pain. Plantar fascia stretch  Sit with your left / right leg crossed over your opposite knee. Hold your heel with one hand with that thumb near your arch. With your other hand, hold your toes and gently pull them back toward the top of your foot. You should feel a stretch on the base (bottom) of your toes, or the bottom of your foot (plantar fascia), or both. Hold this stretch for__________ seconds. Slowly release your toes and return to the starting position. Repeat __________ times. Complete this exercise __________ times a day. Gastrocnemius stretch, standing This exercise is also called a calf (gastroc) stretch. It stretches the muscles in the back of the upper calf. Stand with your hands against a wall. Extend your left / right leg behind you, and bend your front knee slightly. Keeping your heels on the floor, your toes facing forward, and your back knee straight, shift your weight toward the wall. Do not arch your back. You should feel a gentle stretch in your upper calf. Hold this position for __________ seconds. Repeat __________ times. Complete this exercise __________ times a day. Soleus stretch, standing This exercise is also called a calf (soleus) stretch. It stretches the muscles in the back of the lower calf. Stand with your hands against a wall. Extend your left / right leg behind you, and bend your  front knee slightly. Keeping your heels on the floor and your toes facing forward, bend your back knee and shift your weight slightly over your back leg. You should feel a gentle stretch deep in your lower calf. Hold this position for __________ seconds. Repeat __________ times. Complete this exercise __________ times a day. Gastroc and soleus stretch, standing step This exercise stretches the muscles in the back of the lower leg. These muscles are in the upper calf (gastrocnemius) and the lower calf (soleus). Stand with the ball of your left / right foot on the front of a step. The ball of your foot is on the walking surface, right under your toes. Keep your other foot firmly on the same step. Hold on to the wall or a railing for balance. Slowly lift your other foot, allowing your body weight to press your heel down over the edge of the front of the step. Keep knee straight and unbent. You should feel a stretch in your calf. Hold this position for __________ seconds. Return both feet to the step. Repeat this exercise with a slight bend in your left / right knee. Repeat __________ times with your left / right knee straight and __________ times with your left / right knee bent. Complete this exercise __________ times a day. Balance exercise This exercise builds your balance and strength control of your arch to help take pressure off your plantar fascia. Single leg stand If this exercise is too easy, you can try it with your eyes closed or while standing on a pillow. Without shoes, stand near a   railing or in a doorway. You may hold on to the railing or door frame as needed. Stand on your left / right foot. Keep your big toe down on the floor and lift the arch of your foot. You should feel a stretch across the bottom of your foot and your arch. Do not let your foot roll inward. Hold this position for __________ seconds. Repeat __________ times. Complete this exercise __________ times a day. This  information is not intended to replace advice given to you by your health care provider. Make sure you discuss any questions you have with your health care provider. Document Revised: 12/19/2019 Document Reviewed: 12/19/2019 Elsevier Patient Education  2024 [employer].

## 2022-10-13 NOTE — Telephone Encounter (Signed)
Completed, signe, and faxed. I also advised pt

## 2022-10-17 NOTE — Telephone Encounter (Signed)
Call to [employer] for prior Authorization:  [id number].  Spoke with Shanda Bumps.  Medication is still under review.  We will get a letter after approval or denial.

## 2022-10-18 ENCOUNTER — Telehealth (INDEPENDENT_AMBULATORY_CARE_PROVIDER_SITE_OTHER): Payer: Self-pay

## 2022-10-18 NOTE — Telephone Encounter (Signed)
PA for [facility] denied. Pt needs to fail 2,000 mg Metformin every day and her current A1c is stable at 6.1. Patient advised.

## 2022-10-20 NOTE — Telephone Encounter (Signed)
Pa denied, pt advised

## 2022-10-24 NOTE — Telephone Encounter (Signed)
Mardi Mainland, CMA      10/18/22  9:23 AM Note PA for [facility] denied. Pt needs to fail 2,000 mg Metformin every day and her current A1c is stable at 6.1. Patient advised.

## 2022-11-01 ENCOUNTER — Other Ambulatory Visit (INDEPENDENT_AMBULATORY_CARE_PROVIDER_SITE_OTHER): Payer: Self-pay

## 2022-11-01 DIAGNOSIS — E1169 Type 2 diabetes mellitus with other specified complication: Secondary | ICD-10-CM

## 2022-11-01 MED ORDER — METFORMIN HCL ER 500 MG PO TB24
500.0000 mg | ORAL_TABLET | Freq: Every day | ORAL | 0 refills | Status: DC
Start: 2022-11-01 — End: 2022-11-23

## 2022-11-07 ENCOUNTER — Ambulatory Visit (INDEPENDENT_AMBULATORY_CARE_PROVIDER_SITE_OTHER): Payer: Commercial Managed Care - PPO | Admitting: Internal Medicine

## 2022-11-14 ENCOUNTER — Ambulatory Visit: Payer: Commercial Managed Care - PPO | Admitting: Podiatry

## 2022-11-23 ENCOUNTER — Other Ambulatory Visit (HOSPITAL_COMMUNITY): Payer: Self-pay

## 2022-11-23 ENCOUNTER — Encounter (INDEPENDENT_AMBULATORY_CARE_PROVIDER_SITE_OTHER): Payer: Self-pay | Admitting: Internal Medicine

## 2022-11-23 ENCOUNTER — Ambulatory Visit (INDEPENDENT_AMBULATORY_CARE_PROVIDER_SITE_OTHER): Payer: Commercial Managed Care - PPO | Admitting: Internal Medicine

## 2022-11-23 VITALS — BP 126/81 | HR 75 | Temp 98.1°F | Ht 67.0 in | Wt 236.0 lb

## 2022-11-23 DIAGNOSIS — R948 Abnormal results of function studies of other organs and systems: Secondary | ICD-10-CM | POA: Diagnosis not present

## 2022-11-23 DIAGNOSIS — R638 Other symptoms and signs concerning food and fluid intake: Secondary | ICD-10-CM | POA: Diagnosis not present

## 2022-11-23 DIAGNOSIS — Z7984 Long term (current) use of oral hypoglycemic drugs: Secondary | ICD-10-CM

## 2022-11-23 DIAGNOSIS — E1169 Type 2 diabetes mellitus with other specified complication: Secondary | ICD-10-CM

## 2022-11-23 DIAGNOSIS — Z6837 Body mass index (BMI) 37.0-37.9, adult: Secondary | ICD-10-CM

## 2022-11-23 MED ORDER — METFORMIN HCL ER 500 MG PO TB24: 500.0000 mg | ORAL_TABLET | Freq: Every day | ORAL | 0 refills | Status: DC

## 2022-11-23 MED ORDER — ZEPBOUND 2.5 MG/0.5ML ~~LOC~~ SOAJ
2.5000 mg | SUBCUTANEOUS | 0 refills | Status: DC
Start: 2022-11-23 — End: 2022-12-07
  Filled 2022-11-23: qty 2, 28d supply, fill #0

## 2022-11-23 NOTE — Progress Notes (Signed)
Office: [phone]  /  Fax: [phone]  WEIGHT SUMMARY AND BIOMETRICS  Vitals Temp: 98.1 F (36.7 C) BP: 126/81 Pulse Rate: 75 SpO2: 97 %   Anthropometric Measurements Height: 5\' 7"  (1.702 m) Weight: 236 lb (107 kg) BMI (Calculated): 36.95 Weight at Last Visit: 239 lb Weight Lost Since Last Visit: 3 lb Weight Gained Since Last Visit: 0 lb Starting Weight: 238 lb Total Weight Loss (lbs): 2 lb (0.907 kg) Peak Weight: 247 lb   Body Composition  Body Fat %: 45.5 % Fat Mass (lbs): 107.8 lbs Muscle Mass (lbs): 122.4 lbs Total Body Water (lbs): 85.2 lbs Visceral Fat Rating : 13    No data recorded Today's Visit #: 4  Starting Date: 08/17/22   HPI  Chief Complaint: OBESITY  Winola is here to discuss her progress with her obesity treatment plan. She is on the the Category 2 Plan and states she is following her eating plan approximately 50 % of the time. She states she is exercising 30 minutes 5 times per week.  Interval History:  Since last office visit she has lost 3 pounds. She reports fair adherence to reduced calorie nutritional plan. She has been working on increasing protein intake at every meal, eating more vegetables, drinking more water, avoiding and / or reducing liquid calories, making healthier choices, and reducing portion sizes  Orexigenic Control: Reports problems with appetite and hunger signals.  Reports problems with satiety and satiation.  Denies problems with eating patterns and portion control.  Denies abnormal cravings. Denies feeling deprived or restricted.   Barriers identified: strong hunger signals and appetite, cost of medication, and slow metabolism for age.   Pharmacotherapy for weight loss: She is currently taking Metformin (off label use for incretin effect and / or insulin resistance and / or diabetes prevention) with adequate clinical response  and without side effects. and insurance denied Greggory Keen recently .     ASSESSMENT AND PLAN  TREATMENT PLAN FOR OBESITY:  Recommended Dietary Goals  Alexismarie is currently in the action stage of change. As such, her goal is to continue weight management plan. She has agreed to: continue to work on implementation of reduced calorie nutrition plan (RCNP)  Behavioral Intervention  We discussed the following Behavioral Modification Strategies today: increasing lean protein intake, decreasing simple carbohydrates , increasing vegetables, increasing lower glycemic fruits, increasing fiber rich foods, avoiding skipping meals, increasing water intake, work on meal planning and preparation, continue to practice mindfulness when eating, and planning for success.  Additional resources provided today:  Guide to GLP-1 therapy  Recommended Physical Activity Goals  Marly has been advised to work up to 150 minutes of moderate intensity aerobic activity a week and strengthening exercises 2-3 times per week for cardiovascular health, weight loss maintenance and preservation of muscle mass.   She has agreed to :  Think about ways to increase daily physical activity and overcoming barriers to exercise and Increase physical activity in their day and reduce sedentary time (increase NEAT).  Pharmacotherapy We discussed various medication options to help Zionnah with her weight loss efforts and we both agreed to : start anti-obesity medication.  In addition to reduced calorie nutrition plan (RCNP), behavioral strategies and physical activity, Larice would benefit from pharmacotherapy to assist with hunger signals, satiety and cravings. This will reduce obesity-related health risks by inducing weight loss, and help reduce food consumption and adherence to [facility]) . It may also improve QOL by improving self-confidence and reduce the  setbacks associated with  metabolic adaptations.  After discussion of treatment options, mechanisms of action, benefits, side effects,  contraindications and shared decision making she is agreeable to starting Zepbound 2.5 mg once a week. Patient also made aware that medication is indicated for long-term management of obesity and the risk of weight regain following discontinuation of treatment and hence the importance of adhering to medical weight loss plan.    If insurance does not cover incretin therapy, we also reviewed the use of phentermine.  Patient has signed consent.  We also reviewed benefits and risk associated with medication.  Database was reviewed for controlled substances.   ASSOCIATED CONDITIONS ADDRESSED TODAY  Class 2 severe obesity with serious comorbidity and body mass index (BMI) of 37.0 to 37.9 in adult, unspecified obesity type (HCC) Assessment & Plan: Peak weight 250 pounds, associated comorbidities: Type 2 diabetes, hypertension, hyperlipidemia, venous insufficiency, vitamin D deficiency.  Patient has strong orexigenic signaling which makes it difficult for her to maintain a reduced calorie state.    She would benefit from GLP-1 therapy.  If her insurance does not cover Zepbound we will try sympathomimetics next.  Orders: -     Zepbound; Inject 2.5 mg into the skin once a week.  Dispense: 2 mL; Refill: 0  Type 2 diabetes mellitus with other specified complication, without long-term current use of insulin (HCC) Assessment & Plan: She was diagnosed with diabetes in 2020 when she had a hemoglobin A1c of 6.6.  She had done Optavia lost weight but has regained. Her hemoglobin A1c is now 6.1 previously was 5.5 so increasing.  She is on metformin 500 mg XR 1 tablet daily without any adverse effects.  Her insurance denied Mounjaro.  We will prescribe Zepbound to see if she has antiobesity medication benefit.  We also discussed program for cash paying patients through [employer].  Orders: -     metFORMIN HCl ER; Take 1 tablet (500 mg total) by mouth daily with breakfast.  Dispense: 30 tablet; Refill: 0  Abnormal  metabolism Assessment & Plan: Patient has a slower than predicted metabolism. IC 1685 vs. calculated 1807. This may contribute to weight gain, chronic fatigue and difficulty losing weight.  We reviewed measures to improve metabolism including not skipping meals, progressive strengthening exercises, increasing protein intake at every meal and maintaining adequate hydration and sleep.     Abnormal food appetite Assessment & Plan: She has increased orixegenic signaling, impaired satiety and inhibitory control. This is secondary to an abnormal energy regulation system and pathological neurohormonal pathways characteristic of excess adiposity.  In addition to nutritional and behavioral strategies she benefits from pharmacotherapy.       PHYSICAL EXAM:  Blood pressure 126/81, pulse 75, temperature 98.1 F (36.7 C), height 5\' 7"  (1.702 m), weight 236 lb (107 kg), last menstrual period 09/19/2022, SpO2 97%. Body mass index is 36.96 kg/m.  General: She is overweight, cooperative, alert, well developed, and in no acute distress. PSYCH: Has normal mood, affect and thought process.   HEENT: EOMI, sclerae are anicteric. Lungs: Normal breathing effort, no conversational dyspnea. Extremities: No edema.  Neurologic: No gross sensory or motor deficits. No tremors or fasciculations noted.    DIAGNOSTIC DATA REVIEWED:  BMET    Component Value Date/Time   NA 140 08/17/2022 0839   K 4.1 08/17/2022 0839   CL 102 08/17/2022 0839   CO2 22 08/17/2022 0839   GLUCOSE 115 (H) 08/17/2022 0839   GLUCOSE 98 06/13/2019 0855   BUN 16 08/17/2022 0839  CREATININE 0.63 08/17/2022 0839   CALCIUM 9.2 08/17/2022 0839   Lab Results  Component Value Date   HGBA1C 6.1 (H) 08/17/2022   HGBA1C 6.0 10/18/2016   Lab Results  Component Value Date   INSULIN 6.4 08/17/2022   Lab Results  Component Value Date   TSH 2.130 08/17/2022   CBC    Component Value Date/Time   WBC 7.6 08/17/2022 0839   WBC 7.2  04/24/2019 0801   RBC 4.38 08/17/2022 0839   RBC 4.40 04/24/2019 0801   HGB 13.8 08/17/2022 0839   HCT 40.7 08/17/2022 0839   PLT 201.0 04/24/2019 0801   MCV 93 08/17/2022 0839   MCH 31.5 08/17/2022 0839   MCHC 33.9 08/17/2022 0839   MCHC 34.5 04/24/2019 0801   RDW 12.3 08/17/2022 0839   Iron Studies No results found for: "IRON", "TIBC", "FERRITIN", "IRONPCTSAT" Lipid Panel     Component Value Date/Time   CHOL 161 08/17/2022 0839   TRIG 75 08/17/2022 0839   HDL 73 08/17/2022 0839   CHOLHDL 2 04/24/2019 0801   VLDL 11.2 04/24/2019 0801   LDLCALC 74 08/17/2022 0839   Hepatic Function Panel     Component Value Date/Time   PROT 6.8 08/17/2022 0839   ALBUMIN 4.5 08/17/2022 0839   AST 19 08/17/2022 0839   ALT 16 08/17/2022 0839   ALKPHOS 74 08/17/2022 0839   BILITOT 0.6 08/17/2022 0839      Component Value Date/Time   TSH 2.130 08/17/2022 0839   Nutritional Lab Results  Component Value Date   VD25OH 38.1 08/17/2022   VD25OH 54.44 04/24/2019   VD25OH 28.15 (L) 11/09/2017     Return for Week of Sept 30th - 4 weeks - with Dr. Rikki Spearing.Marland Kitchen She was informed of the importance of frequent follow up visits to maximize her success with intensive lifestyle modifications for her multiple health conditions.   ATTESTASTION STATEMENTS:  Reviewed by clinician on day of visit: allergies, medications, problem list, medical history, surgical history, family history, social history, and previous encounter notes.     Worthy Rancher, MD

## 2022-11-23 NOTE — Assessment & Plan Note (Signed)
Peak weight 250 pounds, associated comorbidities: Type 2 diabetes, hypertension, hyperlipidemia, venous insufficiency, vitamin D deficiency.  Patient has strong orexigenic signaling which makes it difficult for her to maintain a reduced calorie state.    She would benefit from GLP-1 therapy.  If her insurance does not cover Zepbound we will try sympathomimetics next.

## 2022-11-23 NOTE — Assessment & Plan Note (Signed)
 She has increased orixegenic signaling, impaired satiety and inhibitory control. This is secondary to an abnormal energy regulation system and pathological neurohormonal pathways characteristic of excess adiposity.  In addition to nutritional and behavioral strategies she benefits from pharmacotherapy.

## 2022-11-23 NOTE — Assessment & Plan Note (Signed)
Patient has a slower than predicted metabolism. IC 1685 vs. calculated 1807. This may contribute to weight gain, chronic fatigue and difficulty losing weight.  We reviewed measures to improve metabolism including not skipping meals, progressive strengthening exercises, increasing protein intake at every meal and maintaining adequate hydration and sleep.

## 2022-11-23 NOTE — Assessment & Plan Note (Signed)
She was diagnosed with diabetes in 2020 when she had a hemoglobin A1c of 6.6.  She had done Optavia lost weight but has regained. Her hemoglobin A1c is now 6.1 previously was 5.5 so increasing.  She is on metformin 500 mg XR 1 tablet daily without any adverse effects.  Her insurance denied Mounjaro.  We will prescribe Zepbound to see if she has antiobesity medication benefit.  We also discussed program for cash paying patients through [employer].

## 2022-11-28 ENCOUNTER — Ambulatory Visit: Payer: Commercial Managed Care - PPO | Admitting: Podiatry

## 2022-11-29 ENCOUNTER — Telehealth (INDEPENDENT_AMBULATORY_CARE_PROVIDER_SITE_OTHER): Payer: Self-pay

## 2022-11-29 ENCOUNTER — Encounter (INDEPENDENT_AMBULATORY_CARE_PROVIDER_SITE_OTHER): Payer: Self-pay | Admitting: Internal Medicine

## 2022-11-29 NOTE — Telephone Encounter (Signed)
Prior auth sent to insurance plan for Zepbound awaiting determination   [id number]

## 2022-11-30 ENCOUNTER — Encounter (INDEPENDENT_AMBULATORY_CARE_PROVIDER_SITE_OTHER): Payer: Self-pay

## 2022-12-07 ENCOUNTER — Other Ambulatory Visit (HOSPITAL_COMMUNITY): Payer: Self-pay

## 2022-12-07 ENCOUNTER — Encounter (INDEPENDENT_AMBULATORY_CARE_PROVIDER_SITE_OTHER): Payer: Self-pay

## 2022-12-07 ENCOUNTER — Other Ambulatory Visit: Payer: Self-pay | Admitting: Bariatrics

## 2022-12-07 DIAGNOSIS — R632 Polyphagia: Secondary | ICD-10-CM

## 2022-12-07 MED ORDER — PHENTERMINE HCL 15 MG PO CAPS
15.0000 mg | ORAL_CAPSULE | ORAL | 0 refills | Status: DC
Start: 2022-12-07 — End: 2022-12-28

## 2022-12-23 ENCOUNTER — Other Ambulatory Visit (INDEPENDENT_AMBULATORY_CARE_PROVIDER_SITE_OTHER): Payer: Self-pay | Admitting: Internal Medicine

## 2022-12-23 DIAGNOSIS — E1169 Type 2 diabetes mellitus with other specified complication: Secondary | ICD-10-CM

## 2022-12-26 ENCOUNTER — Encounter: Payer: Self-pay | Admitting: Family

## 2022-12-28 ENCOUNTER — Encounter (INDEPENDENT_AMBULATORY_CARE_PROVIDER_SITE_OTHER): Payer: Self-pay | Admitting: Internal Medicine

## 2022-12-28 ENCOUNTER — Ambulatory Visit (INDEPENDENT_AMBULATORY_CARE_PROVIDER_SITE_OTHER): Payer: Commercial Managed Care - PPO | Admitting: Internal Medicine

## 2022-12-28 VITALS — BP 135/93 | HR 95 | Temp 98.3°F | Ht 67.0 in | Wt 237.0 lb

## 2022-12-28 DIAGNOSIS — R638 Other symptoms and signs concerning food and fluid intake: Secondary | ICD-10-CM

## 2022-12-28 DIAGNOSIS — Z6837 Body mass index (BMI) 37.0-37.9, adult: Secondary | ICD-10-CM

## 2022-12-28 DIAGNOSIS — E66812 Obesity, class 2: Secondary | ICD-10-CM

## 2022-12-28 DIAGNOSIS — E1169 Type 2 diabetes mellitus with other specified complication: Secondary | ICD-10-CM

## 2022-12-28 DIAGNOSIS — Z7985 Long-term (current) use of injectable non-insulin antidiabetic drugs: Secondary | ICD-10-CM

## 2022-12-28 DIAGNOSIS — R948 Abnormal results of function studies of other organs and systems: Secondary | ICD-10-CM

## 2022-12-28 MED ORDER — TOPIRAMATE 25 MG PO TABS
25.0000 mg | ORAL_TABLET | Freq: Every evening | ORAL | 0 refills | Status: DC
Start: 2022-12-28 — End: 2023-02-06

## 2022-12-28 MED ORDER — PHENTERMINE HCL 15 MG PO CAPS: 15.0000 mg | ORAL_CAPSULE | ORAL | 0 refills | Status: DC

## 2022-12-28 NOTE — Progress Notes (Signed)
[address] [place], [place], [place] [address] Office: [phone]  /  Fax: [phone]   WEIGHT SUMMARY AND BIOMETRICS  Vitals Temp: 98.3 F (36.8 C) BP: (!) 135/93 Pulse Rate: 95 SpO2: 98 %   Anthropometric Measurements Height: 5\' 7"  (1.702 m) Weight: 237 lb (107.5 kg) BMI (Calculated): 37.11 Weight at Last Visit: 236 Weight Lost Since Last Visit: 0 Weight Gained Since Last Visit: 1 lb Starting Weight: 238 lb Total Weight Loss (lbs): 1 lb (0.454 kg) Peak Weight: 247 lb   Body Composition  Body Fat %: 45.6 % Fat Mass (lbs): 108.2 lbs Muscle Mass (lbs): 122.4 lbs Total Body Water (lbs): 88 lbs Visceral Fat Rating : 13     No data recorded Today's Visit #: 5   Starting Date: 08/17/18    Chief Complaint: OBESITY  HPI  Discussed the use of AI scribe software for clinical note transcription with the patient, who gave verbal consent to proceed.  History of Present Illness   The patient, with a history of obesity, presents for a follow-up consultation regarding weight management. She reports no significant changes in health since the last visit. The patient has been on phentermine for approximately two to three weeks, which she reports has helped control her hunger and increased her awareness of when she is genuinely hungry. However, she admits to still eating when not hungry, indicating a struggle with emotional or habitual eating.  The patient has noticed a decrease in her craving for sweets, which she previously identified as a significant challenge. She reports that she no longer feels the need for dessert every night. Despite this progress, the patient identifies carbohydrates as her primary dietary struggle. She has also been making efforts to eat more mindfully, taking time to eat and allowing her body to register satiety.  The patient has not experienced any significant side effects from the phentermine, such as palpitations, jitteriness, or sleep  disturbances. However, she has a history of anxiety, which she has been managing without medication since discontinuing Zoloft. She reports no significant changes in her anxiety levels since starting phentermine.  The patient has also made changes to her environment to support her weight loss efforts, such as removing tempting foods from her home. Despite these efforts, she reports a slight weight gain since the last visit, but believes she is now on a downward trend.  The patient has previously tried other weight loss medications, including Mounjaro and Zebbound, but these were not covered by her insurance. She has also tried dieting methods such as Lajoyce [place]ers, a very low-calorie plan, but found it unsustainable in the long term.  The patient's blood pressure was elevated during the visit, raising concerns about the continued use of phentermine. She has a home blood pressure monitor but has not been regularly checking her blood pressure. She also reports a minor injury from a dog, which does not appear to be of significant concern.  Barriers identified: cost of medication, strong hunger signals and impaired satiety / inhibitory control, and low volume of physical acitivity.   Pharmacotherapy for weight loss: She is currently taking Phentermine (longterm use, single agent)  with adequate clinical response  and experiencing the following side effects: Elevated blood pressure at this office visit..    ASSESSMENT AND PLAN  TREATMENT PLAN FOR OBESITY:  Recommended Dietary Goals  Mia Oconnell is currently in the action stage of change. As such, her goal is to continue weight management plan. She has agreed to: continue current plan  Behavioral  Intervention  We discussed the following Behavioral Modification Strategies today: continue to work on maintaining a reduced calorie state, getting the recommended amount of protein, incorporating whole foods, making healthy choices, staying well hydrated and  practicing mindfulness when eating..  Additional resources provided today: None  Recommended Physical Activity Goals  Ileana has been advised to work up to 150 minutes of moderate intensity aerobic activity a week and strengthening exercises 2-3 times per week for cardiovascular health, weight loss maintenance and preservation of muscle mass.   She has agreed to :  Think about enjoyable ways to increase daily physical activity and overcoming barriers to exercise and Increase physical activity in their day and reduce sedentary time (increase NEAT).  Pharmacotherapy We discussed various medication options to help Marsia with her weight loss efforts and we both agreed to : Patient has strong orixegenic signaling.  Unfortunately her insurance does not cover incretin therapy even with a history of diabetes.  She was started on phentermine 15 mg a day for her blood pressure today is elevated.  She thinks it might be related to stress.  I recommend that she check her blood pressure twice a day over the next few days if her blood pressure at home is above 140/90 I would like for her to discontinue medication.  After discussion of benefits and side effects she will be started on topiramate 25 mg in the evening.  Patient does benefit from incretin therapy.  ASSOCIATED CONDITIONS ADDRESSED TODAY  Abnormal food appetite Assessment & Plan: She has increased orixegenic signaling, impaired satiety and inhibitory control. This is secondary to an abnormal energy regulation system and pathological neurohormonal pathways characteristic of excess adiposity.  In addition to nutritional and behavioral strategies she benefits from pharmacotherapy.  We will consider also referral to behavioral health specialist to assess for possible binge eating tendencies.    Abnormal metabolism Assessment & Plan: Patient has a slower than predicted metabolism. IC 1685 vs. calculated 1807. This may contribute to weight gain,  chronic fatigue and difficulty losing weight.   We reviewed measures to improve metabolism including not skipping meals, progressive strengthening exercises, increasing protein intake at every meal and maintaining adequate hydration and sleep.     Class 2 severe obesity with serious comorbidity and body mass index (BMI) of 37.0 to 37.9 in adult, unspecified obesity type (HCC) Assessment & Plan: Peak weight 250 pounds, associated comorbidities: Type 2 diabetes, hypertension, hyperlipidemia, venous insufficiency, vitamin D deficiency.  Patient has strong orexigenic signaling which makes it difficult for her to maintain a reduced calorie state.    She would benefit from GLP-1 therapy but her insurance denied. She is now on phentermine but blood pressure is elevated we will not increase medication.  She will monitor blood pressure and may have to discontinue.  We are starting topiramate.  Orders: -     Phentermine HCl; Take 1 capsule (15 mg total) by mouth every morning.  Dispense: 30 capsule; Refill: 0 -     Topiramate; Take 1 tablet (25 mg total) by mouth every evening.  Dispense: 30 tablet; Refill: 0  Type 2 diabetes mellitus with other specified complication, without long-term current use of insulin (HCC) Assessment & Plan: She was diagnosed with diabetes in 2020 when she had a hemoglobin A1c of 6.6.  She had done Optavia lost weight but has regained. Her hemoglobin A1c is now 6.1 previously was 5.5 so increasing.  Her insurance denied Mounjaro and Zepbound.  We discussed possibly adding metformin but  she felt that medication was not helpful, but she was at a low dose at the time.     PHYSICAL EXAM:  Blood pressure (!) 135/93, pulse 95, temperature 98.3 F (36.8 C), height 5\' 7"  (1.702 m), weight 237 lb (107.5 kg), SpO2 98%. Body mass index is 37.12 kg/m.  General: She is overweight, cooperative, alert, well developed, and in no acute distress. PSYCH: Has normal mood, affect and  thought process.   HEENT: EOMI, sclerae are anicteric. Lungs: Normal breathing effort, no conversational dyspnea. Extremities: No edema.  Neurologic: No gross sensory or motor deficits. No tremors or fasciculations noted.    DIAGNOSTIC DATA REVIEWED:  BMET    Component Value Date/Time   NA 140 08/17/2022 0839   K 4.1 08/17/2022 0839   CL 102 08/17/2022 0839   CO2 22 08/17/2022 0839   GLUCOSE 115 (H) 08/17/2022 0839   GLUCOSE 98 06/13/2019 0855   BUN 16 08/17/2022 0839   CREATININE 0.63 08/17/2022 0839   CALCIUM 9.2 08/17/2022 0839   Lab Results  Component Value Date   HGBA1C 6.1 (H) 08/17/2022   HGBA1C 5.5 09/16/2019   HGBA1C 6.3 04/24/2019   HGBA1C 6.6 (H) 06/06/2018   HGBA1C 6.3 11/09/2017   Lab Results  Component Value Date   INSULIN 6.4 08/17/2022   Lab Results  Component Value Date   TSH 2.130 08/17/2022   CBC    Component Value Date/Time   WBC 7.6 08/17/2022 0839   WBC 7.2 04/24/2019 0801   RBC 4.38 08/17/2022 0839   RBC 4.40 04/24/2019 0801   HGB 13.8 08/17/2022 0839   HCT 40.7 08/17/2022 0839   PLT 201.0 04/24/2019 0801   MCV 93 08/17/2022 0839   MCH 31.5 08/17/2022 0839   MCHC 33.9 08/17/2022 0839   MCHC 34.5 04/24/2019 0801   RDW 12.3 08/17/2022 0839   Iron Studies No results found for: "IRON", "TIBC", "FERRITIN", "IRONPCTSAT" Lipid Panel     Component Value Date/Time   CHOL 161 08/17/2022 0839   TRIG 75 08/17/2022 0839   HDL 73 08/17/2022 0839   CHOLHDL 2 04/24/2019 0801   VLDL 11.2 04/24/2019 0801   LDLCALC 74 08/17/2022 0839   Lab Results  Component Value Date   CHOL 161 08/17/2022   HDL 73 08/17/2022   LDLCALC 74 08/17/2022   TRIG 75 08/17/2022   Hepatic Function Panel     Component Value Date/Time   PROT 6.8 08/17/2022 0839   ALBUMIN 4.5 08/17/2022 0839   AST 19 08/17/2022 0839   ALT 16 08/17/2022 0839   ALKPHOS 74 08/17/2022 0839   BILITOT 0.6 08/17/2022 0839      Component Value Date/Time   TSH 2.130 08/17/2022 0839    Nutritional Lab Results  Component Value Date   VD25OH 38.1 08/17/2022   VD25OH 54.44 04/24/2019   VD25OH 28.15 (L) 11/09/2017     Return in about 3 weeks (around 01/18/2023) for For Weight Mangement with Dr. Rikki Spearing.Marland Kitchen She was informed of the importance of frequent follow up visits to maximize her success with intensive lifestyle modifications for her multiple health conditions.   ATTESTASTION STATEMENTS:  Reviewed by clinician on day of visit: allergies, medications, problem list, medical history, surgical history, family history, social history, and previous encounter notes.     Worthy Rancher, MD

## 2022-12-28 NOTE — Assessment & Plan Note (Signed)
Patient has a slower than predicted metabolism. IC 1685 vs. calculated 1807. This may contribute to weight gain, chronic fatigue and difficulty losing weight.  We reviewed measures to improve metabolism including not skipping meals, progressive strengthening exercises, increasing protein intake at every meal and maintaining adequate hydration and sleep.

## 2022-12-28 NOTE — Assessment & Plan Note (Signed)
She was diagnosed with diabetes in 2020 when she had a hemoglobin A1c of 6.6.  She had done Optavia lost weight but has regained. Her hemoglobin A1c is now 6.1 previously was 5.5 so increasing.  Her insurance denied Mounjaro and Zepbound.  We discussed possibly adding metformin but she felt that medication was not helpful, but she was at a low dose at the time.

## 2022-12-28 NOTE — Assessment & Plan Note (Signed)
She has increased orixegenic signaling, impaired satiety and inhibitory control. This is secondary to an abnormal energy regulation system and pathological neurohormonal pathways characteristic of excess adiposity.  In addition to nutritional and behavioral strategies she benefits from pharmacotherapy.  We will consider also referral to behavioral health specialist to assess for possible binge eating tendencies.

## 2022-12-28 NOTE — Assessment & Plan Note (Signed)
Peak weight 250 pounds, associated comorbidities: Type 2 diabetes, hypertension, hyperlipidemia, venous insufficiency, vitamin D deficiency.  Patient has strong orexigenic signaling which makes it difficult for her to maintain a reduced calorie state.    She would benefit from GLP-1 therapy but her insurance denied. She is now on phentermine but blood pressure is elevated we will not increase medication.  She will monitor blood pressure and may have to discontinue.  We are starting topiramate.

## 2022-12-29 ENCOUNTER — Other Ambulatory Visit: Payer: Self-pay | Admitting: Family

## 2022-12-29 DIAGNOSIS — I83813 Varicose veins of bilateral lower extremities with pain: Secondary | ICD-10-CM

## 2023-01-16 ENCOUNTER — Ambulatory Visit (INDEPENDENT_AMBULATORY_CARE_PROVIDER_SITE_OTHER): Payer: Commercial Managed Care - PPO | Admitting: Internal Medicine

## 2023-01-19 ENCOUNTER — Encounter (INDEPENDENT_AMBULATORY_CARE_PROVIDER_SITE_OTHER): Payer: Self-pay | Admitting: Internal Medicine

## 2023-01-20 ENCOUNTER — Other Ambulatory Visit (INDEPENDENT_AMBULATORY_CARE_PROVIDER_SITE_OTHER): Payer: Self-pay | Admitting: Internal Medicine

## 2023-01-26 ENCOUNTER — Ambulatory Visit (INDEPENDENT_AMBULATORY_CARE_PROVIDER_SITE_OTHER): Payer: Commercial Managed Care - PPO | Admitting: Internal Medicine

## 2023-01-31 ENCOUNTER — Other Ambulatory Visit (INDEPENDENT_AMBULATORY_CARE_PROVIDER_SITE_OTHER): Payer: Self-pay | Admitting: Nurse Practitioner

## 2023-01-31 DIAGNOSIS — I83813 Varicose veins of bilateral lower extremities with pain: Secondary | ICD-10-CM

## 2023-02-06 ENCOUNTER — Encounter (INDEPENDENT_AMBULATORY_CARE_PROVIDER_SITE_OTHER): Payer: Self-pay | Admitting: Nurse Practitioner

## 2023-02-06 ENCOUNTER — Ambulatory Visit (INDEPENDENT_AMBULATORY_CARE_PROVIDER_SITE_OTHER): Payer: Commercial Managed Care - PPO

## 2023-02-06 ENCOUNTER — Ambulatory Visit (INDEPENDENT_AMBULATORY_CARE_PROVIDER_SITE_OTHER): Payer: Commercial Managed Care - PPO | Admitting: Nurse Practitioner

## 2023-02-06 VITALS — BP 139/88 | HR 71 | Resp 18 | Ht 67.0 in | Wt 245.4 lb

## 2023-02-06 DIAGNOSIS — I83813 Varicose veins of bilateral lower extremities with pain: Secondary | ICD-10-CM

## 2023-02-06 DIAGNOSIS — M79605 Pain in left leg: Secondary | ICD-10-CM

## 2023-02-06 DIAGNOSIS — I10 Essential (primary) hypertension: Secondary | ICD-10-CM

## 2023-02-06 DIAGNOSIS — M79604 Pain in right leg: Secondary | ICD-10-CM

## 2023-02-07 NOTE — Progress Notes (Signed)
Subjective:    Patient ID: Mia Oconnell, female    DOB: [birth year], 57 y.o.   MRN: [id number] Chief Complaint  Patient presents with   New Patient (Initial Visit)    np. le reflux + consult. VV w/pain. arnett, margaret    The patient returns to the office for followup status post laser ablation of the right great saphenous vein in 2015. The patient notes multiple residual varicosities bilaterally which continued to hurt with dependent positions and remained tender to palpation.   the pain  is typically worse in the evening for the patient.  The patient continues to use over-the-counter anti-inflammatory medications to treat the pain and related symptoms but this has not given the patient relief. The patient notes the pain in the lower extremities is causing problems with daily exercise, problems at work and even with household activities such as preparing meals and doing dishes.  The patient is otherwise done well and there have been no complications related to the laser procedure or interval changes in the patient's overall   Today noninvasive studies show evidence of reflux in the  saphenofemoral junction as well as the proximal great saphenous vein.  There is a short segment where no vein is noted in the mid and the area present at the knee.  It appears to be recannulated a perforator    Review of Systems  All other systems reviewed and are negative.      Objective:   Physical Exam Vitals reviewed.  HENT:     Head: Normocephalic.  Cardiovascular:     Rate and Rhythm: Normal rate.  Pulmonary:     Effort: Pulmonary effort is normal.  Skin:    General: Skin is warm and dry.  Neurological:     Mental Status: She is alert and oriented to person, place, and time.  Psychiatric:        Mood and Affect: Mood normal.        Behavior: Behavior normal.        Thought Content: Thought content normal.        Judgment: Judgment normal.     BP 139/88 (BP Location: Left Wrist)    Pulse 71   Resp 18   Ht 5\' 7"  (1.702 m)   Wt 245 lb 6.4 oz (111.3 kg)   BMI 38.44 kg/m   Past Medical History:  Diagnosis Date   Allergy    Anxiety    Constipation    Hx of varicose vein stripping    Hyperlipidemia    Hypertension    Joint pain    Pre-diabetes    Skin cancer    Vitamin D deficiency     Social History   Socioeconomic History   Marital status: Married    Spouse name: Not on file   Number of children: Not on file   Years of education: Not on file   Highest education level: Master's degree (e.g., MA, MS, MEng, MEd, MSW, MBA)  Occupational History   Not on file  Tobacco Use   Smoking status: Never   Smokeless tobacco: Never  Vaping Use   Vaping status: Never Used  Substance and Sexual Activity   Alcohol use: No   Drug use: No   Sexual activity: Yes    Birth control/protection: None  Other Topics Concern   Not on file  Social History Narrative   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      Social Determinants of Health   Financial  Resource Strain: Low Risk  (07/15/2022)   Overall Financial Resource Strain (CARDIA)    Difficulty of Paying Living Expenses: Not hard at all  Food Insecurity: No Food Insecurity (07/15/2022)   Hunger Vital Sign    Worried About Running Out of Food in the Last Year: Never true    Ran Out of Food in the Last Year: Never true  Transportation Needs: No Transportation Needs (07/15/2022)   PRAPARE - Administrator, Civil Service (Medical): No    Lack of Transportation (Non-Medical): No  Physical Activity: Sufficiently Active (07/15/2022)   Exercise Vital Sign    Days of Exercise per Week: 5 days    Minutes of Exercise per Session: 30 min  Stress: No Stress Concern Present (07/15/2022)   [employer] of Occupational Health - Occupational Stress Questionnaire    Feeling of Stress : Only a little  Social Connections: Socially Integrated (07/15/2022)   Social Connection and Isolation Panel [NHANES]     Frequency of Communication with Friends and Family: More than three times a week    Frequency of Social Gatherings with Friends and Family: Three times a week    Attends Religious Services: More than 4 times per year    Active Member of Clubs or Organizations: Yes    Attends Banker Meetings: More than 4 times per year    Marital Status: Married  Catering manager Violence: Not on file    Past Surgical History:  Procedure Laterality Date   BREAST SURGERY     COLONOSCOPY WITH PROPOFOL N/A 08/28/2017   Procedure: COLONOSCOPY WITH PROPOFOL;  Surgeon: Toney Reil, MD;  Location: [facility] ENDOSCOPY;  Service: Gastroenterology;  Laterality: N/A;   COLONOSCOPY WITH PROPOFOL N/A 09/05/2022   Procedure: COLONOSCOPY WITH PROPOFOL;  Surgeon: Toney Reil, MD;  Location: [facility] ENDOSCOPY;  Service: Gastroenterology;  Laterality: N/A;   fracture arm Left    Left radial fracture- 09/2021 , [facility]   FRACTURE SURGERY     SCLERAL BUCKLE     SKIN CANCER EXCISION N/A    05/2022, chest, 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; squamous cell carcinoma in situ   VARICOSE VEIN SURGERY     WRIST SURGERY     10/06/2021    Family History  Problem Relation Age of Onset   Stroke Mother    Hyperlipidemia Mother    Hypertension Mother    Basal cell carcinoma Mother 108   Alzheimer's disease Mother    Anxiety disorder Mother    Obesity Mother    Obesity Father    Hyperlipidemia Father    Hypertension Father    Hyperlipidemia Maternal Grandmother    Hypertension Maternal Grandmother    Hyperlipidemia Maternal Grandfather    Heart disease Maternal Grandfather    Hypertension Maternal Grandfather    Hyperlipidemia Paternal Grandmother    Hypertension Paternal Grandmother    Cancer Paternal Grandfather        colon   Hyperlipidemia Paternal Grandfather    Hypertension Paternal Grandfather    Alcohol abuse Paternal Uncle     Allergies  Allergen Reactions   Lisinopril     cough   Penicillins    Sulfa  Antibiotics        Latest Ref Rng & Units 08/17/2022    8:39 AM 04/24/2019    8:01 AM  CBC  WBC 3.4 - 10.8 x10E3/uL 7.6  7.2   Hemoglobin 11.1 - 15.9 g/dL 16.1  09.6   Hematocrit 34.0 - 46.6 % 40.7  40.6  Platelets 150.0 - 400.0 K/uL  201.0       CMP     Component Value Date/Time   NA 140 08/17/2022 0839   K 4.1 08/17/2022 0839   CL 102 08/17/2022 0839   CO2 22 08/17/2022 0839   GLUCOSE 115 (H) 08/17/2022 0839   GLUCOSE 98 06/13/2019 0855   BUN 16 08/17/2022 0839   CREATININE 0.63 08/17/2022 0839   CALCIUM 9.2 08/17/2022 0839   PROT 6.8 08/17/2022 0839   ALBUMIN 4.5 08/17/2022 0839   AST 19 08/17/2022 0839   ALT 16 08/17/2022 0839   ALKPHOS 74 08/17/2022 0839   BILITOT 0.6 08/17/2022 0839   GFR 97.42 06/13/2019 0855   EGFR 105 08/17/2022 0839     No results found.     Assessment & Plan:   1. Varicose veins of both lower extremities with pain Recommend:  The patient has had successful ablation of the previously incompetent saphenous venous system but still has persistent symptoms of pain and swelling that are having a negative impact on daily life and daily activities.  Patient should undergo injection sclerotherapy to treat the residual varicosities.  The risks, benefits and alternative therapies were reviewed in detail with the patient.  All questions were answered.  The patient agrees to proceed with foam sclerotherapy at their convenience.  The patient will continue wearing the graduated compression stockings and using the over-the-counter pain medications to treat her symptoms.      2. Pain in both lower extremities While I do believe that some of the pain the patient is having is coming from her varicosities she continues to have pain further workup will be necessary.  3. Essential hypertension Continue antihypertensive medications as already ordered, these medications have been reviewed and there are no changes at this time.   Current Outpatient  Medications on File Prior to Visit  Medication Sig Dispense Refill   amLODipine (NORVASC) 5 MG tablet TAKE 1 TABLET BY MOUTH EVERY DAY 90 tablet 3   atorvastatin (LIPITOR) 40 MG tablet TAKE 1 TABLET BY MOUTH EVERY DAY AT 6 PM 90 tablet 3   Cholecalciferol (VITAMIN D) 125 MCG (5000 UT) CAPS      levocetirizine (XYZAL ALLERGY 24HR) 5 MG tablet      Multiple Vitamins-Minerals (CENTRUM SILVER 50+WOMEN) TABS      Omega-3 Fatty Acids (FISH OIL) 1000 MG CAPS      No current facility-administered medications on file prior to visit.    There are no Patient Instructions on file for this visit. No follow-ups on file.   Georgiana Spinner, NP

## 2023-03-03 ENCOUNTER — Encounter (INDEPENDENT_AMBULATORY_CARE_PROVIDER_SITE_OTHER): Payer: Self-pay

## 2023-03-07 LAB — HM DIABETES EYE EXAM

## 2023-03-08 ENCOUNTER — Encounter (INDEPENDENT_AMBULATORY_CARE_PROVIDER_SITE_OTHER): Payer: Self-pay

## 2023-03-10 ENCOUNTER — Encounter: Payer: Self-pay | Admitting: Family

## 2023-03-10 NOTE — Telephone Encounter (Signed)
 Care team updated and letter sent for eye exam notes.

## 2023-03-27 ENCOUNTER — Ambulatory Visit (INDEPENDENT_AMBULATORY_CARE_PROVIDER_SITE_OTHER): Payer: 59 | Admitting: Internal Medicine

## 2023-03-27 ENCOUNTER — Encounter (INDEPENDENT_AMBULATORY_CARE_PROVIDER_SITE_OTHER): Payer: Self-pay | Admitting: Internal Medicine

## 2023-03-27 VITALS — BP 138/84 | HR 76 | Temp 98.3°F | Ht 67.0 in | Wt 245.0 lb

## 2023-03-27 DIAGNOSIS — E1169 Type 2 diabetes mellitus with other specified complication: Secondary | ICD-10-CM

## 2023-03-27 DIAGNOSIS — E66812 Obesity, class 2: Secondary | ICD-10-CM | POA: Diagnosis not present

## 2023-03-27 DIAGNOSIS — R638 Other symptoms and signs concerning food and fluid intake: Secondary | ICD-10-CM

## 2023-03-27 DIAGNOSIS — I10 Essential (primary) hypertension: Secondary | ICD-10-CM | POA: Diagnosis not present

## 2023-03-27 DIAGNOSIS — Z6837 Body mass index (BMI) 37.0-37.9, adult: Secondary | ICD-10-CM

## 2023-03-27 DIAGNOSIS — Z7985 Long-term (current) use of injectable non-insulin antidiabetic drugs: Secondary | ICD-10-CM

## 2023-03-27 MED ORDER — ZEPBOUND 2.5 MG/0.5ML ~~LOC~~ SOAJ
2.5000 mg | SUBCUTANEOUS | 0 refills | Status: DC
Start: 2023-03-27 — End: 2023-04-25

## 2023-03-27 NOTE — Progress Notes (Signed)
 Office: [phone]  /  Fax: [phone]  Weight Summary And Biometrics  Vitals Temp: 98.3 F (36.8 C) BP: 138/84 Pulse Rate: 76 SpO2: 99 %   Anthropometric Measurements Height: 5' 7 (1.702 m) Weight: 245 lb (111.1 kg) BMI (Calculated): 38.36 Weight at Last Visit: 237 lb Weight Lost Since Last Visit: 0 lb Weight Gained Since Last Visit: 8 lb Starting Weight: 238 lb Total Weight Loss (lbs): 0 lb (0 kg) Peak Weight: 247 lb   Body Composition  Body Fat %: 47.8 % Fat Mass (lbs): 117.4 lbs Muscle Mass (lbs): 121.6 lbs Total Body Water (lbs): 88.2 lbs Visceral Fat Rating : 14    No data recorded Today's Visit #: 6  Starting Date: 08/17/18   Subjective   Chief Complaint: Obesity  Mia Oconnell is here to discuss her progress with her obesity treatment plan. She is on the the Category 2 Plan and states she is following her eating plan approximately 20 % of the time. She states she is exercising 30 minutes 4 times per week.  Interval History:   Discussed the use of AI scribe software for clinical note transcription with the patient, who gave verbal consent to proceed.  History of Present Illness   Patient presents today for medical weight management.  Since last office visit she has gained 8 pounds the patient attributes this weight gain to stress eating due to personal issues, including caring for a mother with Alzheimer's and marital stress. The patient reports that these stressors have led to an increase in food consumption, particularly in the evenings.  The patient has a history of dieting and has tried various methods to lose weight. However, the patient identifies stress eating as the primary obstacle to weight loss. The patient reports getting approximately six to seven hours of uninterrupted sleep per night and describes her stress levels as moderately high.  The patient acknowledges that eating provides temporary relief from stress but is followed by feelings  of guilt. This pattern of stress eating has been ongoing for a long time, but the patient has only recently started to pay more attention to it. The patient rates her hunger at a moderate level of five to six on a scale of one to ten.  The patient reports eating out three to four times a week and acknowledges that portion sizes are often larger than necessary. Despite knowing the right foods to eat, the patient struggles with making healthy choices consistently. The patient has recently purchased a walking pad for home use, hoping to use physical activity as a means to manage stress instead of eating.  She is inquiring about treatment with Zepbound .  She feels that medication may support her weight loss efforts.    Orexigenic Control:  Reports problems with appetite and hunger signals.  Denies problems with satiety and satiation.  Reports problems with eating patterns and portion control.  Reports abnormal cravings. Denies feeling deprived or restricted.   Barriers identified: strong hunger signals and impaired satiety / inhibitory control, having difficulty focusing on healthy eating, difficulty implementing reduced calorie nutrition plan, difficulty maintaining a reduced calorie state, and moderate to high levels of stress.   Pharmacotherapy for weight loss: She is currently taking no anti-obesity medication.   Assessment and Plan   Treatment Plan For Obesity:  Recommended Dietary Goals  [name] is currently in the action stage of change. As such, her goal is to continue weight management plan. She has agreed to: continue current plan  Behavioral Intervention  We discussed the following Behavioral Modification Strategies today: continue to work on maintaining a reduced calorie state, getting the recommended amount of protein, incorporating whole foods, making healthy choices, staying well hydrated and practicing mindfulness when eating..  Additional resources provided today:  None  Recommended Physical Activity Goals  [name] has been advised to work up to 150 minutes of moderate intensity aerobic activity a week and strengthening exercises 2-3 times per week for cardiovascular health, weight loss maintenance and preservation of muscle mass.   She has agreed to :  continue to gradually increase the amount and intensity of exercise routine  Pharmacotherapy  We discussed various medication options to help [name] with her weight loss efforts and we both agreed to : start anti-obesity medication.  In addition to reduced calorie nutrition plan (RCNP), behavioral strategies and physical activity, Leidy would benefit from pharmacotherapy to assist with hunger signals, satiety and cravings. This will reduce obesity-related health risks by inducing weight loss, and help reduce food consumption and adherence to [facility]) . It may also improve QOL by improving self-confidence and reduce the  setbacks associated with metabolic adaptations.  She also has a history of type 2 diabetes, hypertension, venous insufficiency, hyperlipidemia, abnormal metabolism and food appetite.  After discussion of treatment options, mechanisms of action, benefits, side effects, contraindications and shared decision making she is agreeable to starting Zepbound  2.5 mg once a week. Patient also made aware that medication is indicated for long-term management of obesity and the risk of weight regain following discontinuation of treatment and hence the importance of adhering to medical weight loss plan.  We demonstrated use of device and patient using teach back method was able to demonstrate proper technique.  Associated Conditions Addressed and Impacted by Obesity Treatment  Class 2 severe obesity with serious comorbidity and body mass index (BMI) of 37.0 to 37.9 in adult, unspecified obesity type [facility]) Assessment & Plan: See obesity treatment plan  Orders: -     Zepbound ; Inject 2.5 mg into the  skin once a week.  Dispense: 2 mL; Refill: 0  Abnormal food appetite Assessment & Plan: She has increased orixegenic signaling, impaired satiety and inhibitory control. This is secondary to an abnormal energy regulation system and pathological neurohormonal pathways characteristic of excess adiposity.  She also has a component of stress eating.  In addition to nutritional and behavioral strategies she benefits from pharmacotherapy with GLP-1.  We discussed referral to behavioral health specialist if needed in the future.   Orders: -     Zepbound ; Inject 2.5 mg into the skin once a week.  Dispense: 2 mL; Refill: 0  Type 2 diabetes mellitus with other specified complication, without long-term current use of insulin  [facility]) Assessment & Plan: She was diagnosed with diabetes in 2020 when she had a hemoglobin A1c of 6.6.  She had done Optavia lost weight but has regained. Her hemoglobin A1c is now 6.1 previously was 5.5 so increasing.  I feel that patient would benefit from GLP-1 therapy for weight management.  She will be prescribed Zepbound  2.5 mg once a week.  Orders: -     Zepbound ; Inject 2.5 mg into the skin once a week.  Dispense: 2 mL; Refill: 0  Essential hypertension Assessment & Plan: Blood pressure is above target today.  She is on amlodipine  5 mg a day.  Patient advised to start checking her blood pressure at home in the morning and before bedtime 3-4 times a week.  If her blood pressure is above  130/80 she will contact primary care team for medication adjustments.  Losing 10% of body weight may improve blood pressure control  Orders: -     Zepbound ; Inject 2.5 mg into the skin once a week.  Dispense: 2 mL; Refill: 0           Objective   Physical Exam:  Blood pressure 138/84, pulse 76, temperature 98.3 F (36.8 C), height 5' 7 (1.702 m), weight 245 lb (111.1 kg), SpO2 99%. Body mass index is 38.37 kg/m.  General: She is overweight, cooperative, alert, well developed,  and in no acute distress. PSYCH: Has normal mood, affect and thought process.   HEENT: EOMI, sclerae are anicteric. Lungs: Normal breathing effort, no conversational dyspnea. Extremities: No edema.  Neurologic: No gross sensory or motor deficits. No tremors or fasciculations noted.    Diagnostic Data Reviewed:  BMET    Component Value Date/Time   NA 140 08/17/2022 0839   K 4.1 08/17/2022 0839   CL 102 08/17/2022 0839   CO2 22 08/17/2022 0839   GLUCOSE 115 (H) 08/17/2022 0839   GLUCOSE 98 06/13/2019 0855   BUN 16 08/17/2022 0839   CREATININE 0.63 08/17/2022 0839   CALCIUM  9.2 08/17/2022 0839   Lab Results  Component Value Date   HGBA1C 6.1 (H) 08/17/2022   HGBA1C 6.0 10/18/2016   Lab Results  Component Value Date   INSULIN  6.4 08/17/2022   Lab Results  Component Value Date   TSH 2.130 08/17/2022   CBC    Component Value Date/Time   WBC 7.6 08/17/2022 0839   WBC 7.2 04/24/2019 0801   RBC 4.38 08/17/2022 0839   RBC 4.40 04/24/2019 0801   HGB 13.8 08/17/2022 0839   HCT 40.7 08/17/2022 0839   PLT 201.0 04/24/2019 0801   MCV 93 08/17/2022 0839   MCH 31.5 08/17/2022 0839   MCHC 33.9 08/17/2022 0839   MCHC 34.5 04/24/2019 0801   RDW 12.3 08/17/2022 0839   Iron Studies No results found for: IRON, TIBC, FERRITIN, IRONPCTSAT Lipid Panel     Component Value Date/Time   CHOL 161 08/17/2022 0839   TRIG 75 08/17/2022 0839   HDL 73 08/17/2022 0839   CHOLHDL 2 04/24/2019 0801   VLDL 11.2 04/24/2019 0801   LDLCALC 74 08/17/2022 0839   Hepatic Function Panel     Component Value Date/Time   PROT 6.8 08/17/2022 0839   ALBUMIN 4.5 08/17/2022 0839   AST 19 08/17/2022 0839   ALT 16 08/17/2022 0839   ALKPHOS 74 08/17/2022 0839   BILITOT 0.6 08/17/2022 0839      Component Value Date/Time   TSH 2.130 08/17/2022 0839   Nutritional Lab Results  Component Value Date   VD25OH 38.1 08/17/2022   VD25OH 54.44 04/24/2019   VD25OH 28.15 (L) 11/09/2017     Follow-Up   Return in about 4 weeks (around 04/24/2023) for For Weight Mangement with Dr. Francyne.SABRA She was informed of the importance of frequent follow up visits to maximize her success with intensive lifestyle modifications for her multiple health conditions.  Attestation Statement   Reviewed by clinician on day of visit: allergies, medications, problem list, medical history, surgical history, family history, social history, and previous encounter notes.     Lucas Francyne, MD

## 2023-03-27 NOTE — Assessment & Plan Note (Signed)
 She has increased orixegenic signaling, impaired satiety and inhibitory control. This is secondary to an abnormal energy regulation system and pathological neurohormonal pathways characteristic of excess adiposity.  She also has a component of stress eating.  In addition to nutritional and behavioral strategies she benefits from pharmacotherapy with GLP-1.  We discussed referral to behavioral health specialist if needed in the future.

## 2023-03-27 NOTE — Assessment & Plan Note (Signed)
 Blood pressure is above target today.  She is on amlodipine  5 mg a day.  Patient advised to start checking her blood pressure at home in the morning and before bedtime 3-4 times a week.  If her blood pressure is above 130/80 she will contact primary care team for medication adjustments.  Losing 10% of body weight may improve blood pressure control

## 2023-03-27 NOTE — Assessment & Plan Note (Signed)
 She was diagnosed with diabetes in 2020 when she had a hemoglobin A1c of 6.6.  She had done Optavia lost weight but has regained. Her hemoglobin A1c is now 6.1 previously was 5.5 so increasing.  I feel that patient would benefit from GLP-1 therapy for weight management.  She will be prescribed Zepbound  2.5 mg once a week.

## 2023-03-27 NOTE — Assessment & Plan Note (Signed)
 See obesity treatment plan

## 2023-03-29 ENCOUNTER — Telehealth: Payer: Self-pay

## 2023-03-29 NOTE — Telephone Encounter (Signed)
 PA submitted through Cover My Meds for Zepbound. Awaiting insurance determination. Key: [id number]

## 2023-03-30 NOTE — Telephone Encounter (Signed)
 Zepbound has been approved Request Reference Number: [id number]. ZEPBOUND INJ 2.5MG  is approved through 06/27/2023. Your patient may now fill this prescription and it will be covered.

## 2023-03-31 ENCOUNTER — Other Ambulatory Visit: Payer: Self-pay

## 2023-03-31 DIAGNOSIS — Z760 Encounter for issue of repeat prescription: Secondary | ICD-10-CM

## 2023-03-31 DIAGNOSIS — E782 Mixed hyperlipidemia: Secondary | ICD-10-CM

## 2023-03-31 MED ORDER — ATORVASTATIN CALCIUM 40 MG PO TABS: ORAL_TABLET | ORAL | 3 refills | Status: DC

## 2023-04-05 ENCOUNTER — Other Ambulatory Visit: Payer: Self-pay

## 2023-04-05 DIAGNOSIS — E782 Mixed hyperlipidemia: Secondary | ICD-10-CM

## 2023-04-05 DIAGNOSIS — Z760 Encounter for issue of repeat prescription: Secondary | ICD-10-CM

## 2023-04-05 DIAGNOSIS — I10 Essential (primary) hypertension: Secondary | ICD-10-CM

## 2023-04-05 MED ORDER — AMLODIPINE BESYLATE 5 MG PO TABS: 5.0000 mg | ORAL_TABLET | Freq: Every day | ORAL | 3 refills | Status: DC

## 2023-04-05 MED ORDER — ATORVASTATIN CALCIUM 40 MG PO TABS: ORAL_TABLET | ORAL | 3 refills | Status: DC

## 2023-04-12 ENCOUNTER — Encounter: Payer: Self-pay | Admitting: Family

## 2023-04-12 DIAGNOSIS — E782 Mixed hyperlipidemia: Secondary | ICD-10-CM

## 2023-04-12 DIAGNOSIS — Z760 Encounter for issue of repeat prescription: Secondary | ICD-10-CM

## 2023-04-12 DIAGNOSIS — I10 Essential (primary) hypertension: Secondary | ICD-10-CM

## 2023-04-12 DIAGNOSIS — E1169 Type 2 diabetes mellitus with other specified complication: Secondary | ICD-10-CM

## 2023-04-12 DIAGNOSIS — E66812 Obesity, class 2: Secondary | ICD-10-CM

## 2023-04-12 DIAGNOSIS — R638 Other symptoms and signs concerning food and fluid intake: Secondary | ICD-10-CM

## 2023-04-12 MED ORDER — AMLODIPINE BESYLATE 5 MG PO TABS: 5.0000 mg | ORAL_TABLET | Freq: Every day | ORAL | 3 refills | Status: DC

## 2023-04-12 MED ORDER — ATORVASTATIN CALCIUM 40 MG PO TABS: ORAL_TABLET | ORAL | 3 refills | Status: AC

## 2023-04-14 ENCOUNTER — Telehealth (INDEPENDENT_AMBULATORY_CARE_PROVIDER_SITE_OTHER): Payer: Self-pay | Admitting: Vascular Surgery

## 2023-04-14 NOTE — Telephone Encounter (Signed)
LVM for pt TCB and schedule appt  right FOAM sclero. see gs. no prior auth req decision [id number]

## 2023-04-16 ENCOUNTER — Encounter: Payer: Self-pay | Admitting: Family

## 2023-04-25 ENCOUNTER — Encounter (INDEPENDENT_AMBULATORY_CARE_PROVIDER_SITE_OTHER): Payer: Self-pay | Admitting: Internal Medicine

## 2023-04-25 ENCOUNTER — Ambulatory Visit (INDEPENDENT_AMBULATORY_CARE_PROVIDER_SITE_OTHER): Payer: 59 | Admitting: Internal Medicine

## 2023-04-25 DIAGNOSIS — Z7985 Long-term (current) use of injectable non-insulin antidiabetic drugs: Secondary | ICD-10-CM

## 2023-04-25 DIAGNOSIS — E1169 Type 2 diabetes mellitus with other specified complication: Secondary | ICD-10-CM

## 2023-04-25 DIAGNOSIS — Z6837 Body mass index (BMI) 37.0-37.9, adult: Secondary | ICD-10-CM

## 2023-04-25 DIAGNOSIS — E66812 Obesity, class 2: Secondary | ICD-10-CM

## 2023-04-25 DIAGNOSIS — R638 Other symptoms and signs concerning food and fluid intake: Secondary | ICD-10-CM | POA: Diagnosis not present

## 2023-04-25 MED ORDER — TIRZEPATIDE-WEIGHT MANAGEMENT 5 MG/0.5ML ~~LOC~~ SOAJ: 5.0000 mg | SUBCUTANEOUS | 0 refills | Status: DC

## 2023-04-25 NOTE — Assessment & Plan Note (Signed)
She was diagnosed with diabetes in 2020 when she had a hemoglobin A1c of 6.6.  Her most recent A1c is 6.1.  She is now on Zepbound for pharmacoprophylaxis.  Continue with nutritional and behavioral strategies.

## 2023-04-25 NOTE — Progress Notes (Signed)
 Office: [phone]  /  Fax: [phone]  Weight Summary And Biometrics  Vitals Temp: 98.3 F (36.8 C) BP: 139/84 Pulse Rate: 77 SpO2: 99 %   Anthropometric Measurements Height: 5' 7 (1.702 m) Weight: 242 lb (109.8 kg) BMI (Calculated): 37.89 Weight at Last Visit: 245 lb Weight Lost Since Last Visit: 3 lb Weight Gained Since Last Visit: 0 lb Starting Weight: 238 lb Total Weight Loss (lbs): 0 lb (0 kg) Peak Weight: 247 lb   Body Composition  Body Fat %: 47.5 % Fat Mass (lbs): 115.2 lbs Muscle Mass (lbs): 120.8 lbs Total Body Water (lbs): 91.2 lbs Visceral Fat Rating : 14    No data recorded Today's Visit #: 7  Starting Date: 08/17/18   Subjective   Chief Complaint: Obesity  Mia Oconnell is here to discuss her progress with her obesity treatment plan. She is on the 1200 calorie plan and states she is following her eating plan approximately 70 % of the time. She states she is exercising 30 minutes 3-4 times per week.  Weight Progress Since Last Visit:  Discussed the use of AI scribe software for clinical note transcription with the patient, who gave verbal consent to proceed.  History of Present Illness   Mia Oconnell is a 58 year old female with obesity, type 2 diabetes, and essential hypertension who presents for medical weight management.  She is undergoing medical weight management for obesity and was started on Zepbound  2.5 mg once a week during her last office visit, resulting in a weight loss of three pounds. She adheres to a reduced calorie nutrition plan 70% of the time and engages in physical activity for about 30 minutes three to four times per week.  Following the initial dose of Zepbound , she experienced one day of diarrhea, which she managed at home. She reports a significant loss of appetite and a reduction in 'food noise', indicating less focus on food since starting the medication. She is currently on the initial dose of Zepbound  and finds it  effective in suppressing her appetite.  She is uncertain about her eating habits, questioning whether to eat only when hungry or maintain a certain calorie intake. Initially, she attempted to consume 1200 calories daily, even when not hungry, to avoid metabolic issues, but noticed her weight loss plateaued at three pounds after initially losing almost four and a half pounds. She occasionally overeats, such as consuming more popcorn than intended, but generally focuses on eating appropriately. She experiences constipation, a condition present since infancy, and is aware of the need to stay hydrated and increase fiber intake to manage it.  She has been increasing her physical activity by walking more frequently as the weather improves, and is mindful of the need to gradually increase the intensity and duration of her walks to support her weight loss efforts.      Challenges affecting patient progress: strong hunger signals and/or impaired satiety / inhibitory control and low volume of physical activity at present .   Orexigenic Control: Reports improved problems with appetite and hunger signals.  Reports improved problems with satiety and satiation.  Reports improved problems with eating patterns and portion control.  Denies abnormal cravings. Denies feeling deprived or restricted.   Pharmacotherapy for weight management: She is currently taking Zepbound  with adequate clinical response  and without side effects..   Assessment and Plan   Treatment Plan For Obesity:  Recommended Dietary Goals  Mia Oconnell is currently in the action stage of change. As such, her goal  is to continue weight management plan. She has agreed to: continue current plan  Behavioral Health and Counseling  We discussed the following behavioral modification strategies today: increasing lean protein intake to established goals, increasing fiber rich foods, avoiding skipping meals, and increasing water intake .  Additional  education and resources provided today: Personalized instruction on the use of artificial intelligence for recipes, tailored meal plans, calorie tracking, and finding healthier options when eating out.   Recommended Physical Activity Goals  Mia Oconnell has been advised to work up to 150 minutes of moderate intensity aerobic activity a week and strengthening exercises 2-3 times per week for cardiovascular health, weight loss maintenance and preservation of muscle mass.   She has agreed to :  continue to gradually increase the amount and intensity of exercise routine  Pharmacotherapy  We discussed various medication options to help Mia Oconnell with her weight loss efforts and we both agreed to : increase Zepbound  to maintenance dose of 5 mg once a week  Associated Conditions Impacted by Obesity Treatment  Class 2 severe obesity with serious comorbidity and body mass index (BMI) of 37.0 to 37.9 in adult, unspecified obesity type [facility]) Assessment & Plan: Presents for medical weight management. On Zepbound  2.5 mg weekly, lost 3 pounds. Following reduced calorie nutrition plan 70% of the time, exercising 30 minutes 3-4 times per week. Initially experienced nausea and diarrhea, now tolerating well. Significant appetite suppression and reduced focus on food. Discussed maintaining at least 1200 calories per day to avoid adaptive thermogenesis and restrictive eating patterns, which can lead to weight gain. Explained that the medication suppresses appetite but does not affect metabolism or burn fat. Discussed balancing calorie intake to avoid metabolic slowdown. Informed that insurance may require an increase to 5 mg for continued coverage. - Increase Zepbound  to 5 mg once a week - Ensure intake of at least 1200 calories per day - Consume three meals a day with a focus on protein intake (30-40 grams per meal) - Increase water intake to 90 ounces per day - Increase fiber intake to prevent constipation -  Continue physical activity, gradually increasing time, speed, and distance  Orders: -     Tirzepatide -Weight Management; Inject 5 mg into the skin once a week.  Dispense: 2 mL; Refill: 0  Abnormal food appetite Assessment & Plan: Improved on Zepbound  She has increased orixegenic signaling, impaired satiety and inhibitory control. This is secondary to an abnormal energy regulation system and pathological neurohormonal pathways characteristic of excess adiposity.  She also has a component of stress eating.  We will increase Zepbound  to 5 mg once a week   Orders: -     Tirzepatide -Weight Management; Inject 5 mg into the skin once a week.  Dispense: 2 mL; Refill: 0  Type 2 diabetes mellitus with other specified complication, without long-term current use of insulin  [facility]) Assessment & Plan: She was diagnosed with diabetes in 2020 when she had a hemoglobin A1c of 6.6.  Her most recent A1c is 6.1.  She is now on Zepbound  for pharmacoprophylaxis.  Continue with nutritional and behavioral strategies.  Orders: -     Tirzepatide -Weight Management; Inject 5 mg into the skin once a week.  Dispense: 2 mL; Refill: 0          Objective   Physical Exam:  Blood pressure 139/84, pulse 77, temperature 98.3 F (36.8 C), height 5' 7 (1.702 m), weight 242 lb (109.8 kg), SpO2 99%. Body mass index is 37.9 kg/m.  General: She is  overweight, cooperative, alert, well developed, and in no acute distress. PSYCH: Has normal mood, affect and thought process.   HEENT: EOMI, sclerae are anicteric. Lungs: Normal breathing effort, no conversational dyspnea. Extremities: No edema.  Neurologic: No gross sensory or motor deficits. No tremors or fasciculations noted.    Diagnostic Data Reviewed:  BMET    Component Value Date/Time   NA 140 08/17/2022 0839   K 4.1 08/17/2022 0839   CL 102 08/17/2022 0839   CO2 22 08/17/2022 0839   GLUCOSE 115 (H) 08/17/2022 0839   GLUCOSE 98 06/13/2019 0855   BUN 16  08/17/2022 0839   CREATININE 0.63 08/17/2022 0839   CALCIUM  9.2 08/17/2022 0839   Lab Results  Component Value Date   HGBA1C 6.1 (H) 08/17/2022   HGBA1C 6.0 10/18/2016   Lab Results  Component Value Date   INSULIN  6.4 08/17/2022   Lab Results  Component Value Date   TSH 2.130 08/17/2022   CBC    Component Value Date/Time   WBC 7.6 08/17/2022 0839   WBC 7.2 04/24/2019 0801   RBC 4.38 08/17/2022 0839   RBC 4.40 04/24/2019 0801   HGB 13.8 08/17/2022 0839   HCT 40.7 08/17/2022 0839   PLT 201.0 04/24/2019 0801   MCV 93 08/17/2022 0839   MCH 31.5 08/17/2022 0839   MCHC 33.9 08/17/2022 0839   MCHC 34.5 04/24/2019 0801   RDW 12.3 08/17/2022 0839   Iron Studies No results found for: IRON, TIBC, FERRITIN, IRONPCTSAT Lipid Panel     Component Value Date/Time   CHOL 161 08/17/2022 0839   TRIG 75 08/17/2022 0839   HDL 73 08/17/2022 0839   CHOLHDL 2 04/24/2019 0801   VLDL 11.2 04/24/2019 0801   LDLCALC 74 08/17/2022 0839   Hepatic Function Panel     Component Value Date/Time   PROT 6.8 08/17/2022 0839   ALBUMIN 4.5 08/17/2022 0839   AST 19 08/17/2022 0839   ALT 16 08/17/2022 0839   ALKPHOS 74 08/17/2022 0839   BILITOT 0.6 08/17/2022 0839      Component Value Date/Time   TSH 2.130 08/17/2022 0839   Nutritional Lab Results  Component Value Date   VD25OH 38.1 08/17/2022   VD25OH 54.44 04/24/2019   VD25OH 28.15 (L) 11/09/2017    Follow-Up   Return in about 4 weeks (around 05/23/2023) for For Weight Mangement with Dr. Francyne.SABRA She was informed of the importance of frequent follow up visits to maximize her success with intensive lifestyle modifications for her multiple health conditions.  Attestation Statement   Reviewed by clinician on day of visit: allergies, medications, problem list, medical history, surgical history, family history, social history, and previous encounter notes.     Lucas Francyne, MD

## 2023-04-25 NOTE — Assessment & Plan Note (Signed)
 Improved on Zepbound  She has increased orixegenic signaling, impaired satiety and inhibitory control. This is secondary to an abnormal energy regulation system and pathological neurohormonal pathways characteristic of excess adiposity.  She also has a component of stress eating.  We will increase Zepbound  to 5 mg once a week

## 2023-04-25 NOTE — Assessment & Plan Note (Signed)
 Presents for medical weight management. On Zepbound  2.5 mg weekly, lost 3 pounds. Following reduced calorie nutrition plan 70% of the time, exercising 30 minutes 3-4 times per week. Initially experienced nausea and diarrhea, now tolerating well. Significant appetite suppression and reduced focus on food. Discussed maintaining at least 1200 calories per day to avoid adaptive thermogenesis and restrictive eating patterns, which can lead to weight gain. Explained that the medication suppresses appetite but does not affect metabolism or burn fat. Discussed balancing calorie intake to avoid metabolic slowdown. Informed that insurance may require an increase to 5 mg for continued coverage. - Increase Zepbound  to 5 mg once a week - Ensure intake of at least 1200 calories per day - Consume three meals a day with a focus on protein intake (30-40 grams per meal) - Increase water intake to 90 ounces per day - Increase fiber intake to prevent constipation - Continue physical activity, gradually increasing time, speed, and distance

## 2023-05-24 ENCOUNTER — Encounter (INDEPENDENT_AMBULATORY_CARE_PROVIDER_SITE_OTHER): Payer: Self-pay | Admitting: Internal Medicine

## 2023-05-24 ENCOUNTER — Ambulatory Visit (INDEPENDENT_AMBULATORY_CARE_PROVIDER_SITE_OTHER): Payer: 59 | Admitting: Internal Medicine

## 2023-05-24 DIAGNOSIS — E66812 Obesity, class 2: Secondary | ICD-10-CM | POA: Diagnosis not present

## 2023-05-24 DIAGNOSIS — E1169 Type 2 diabetes mellitus with other specified complication: Secondary | ICD-10-CM | POA: Diagnosis not present

## 2023-05-24 DIAGNOSIS — Z7985 Long-term (current) use of injectable non-insulin antidiabetic drugs: Secondary | ICD-10-CM

## 2023-05-24 DIAGNOSIS — R638 Other symptoms and signs concerning food and fluid intake: Secondary | ICD-10-CM | POA: Diagnosis not present

## 2023-05-24 DIAGNOSIS — Z6837 Body mass index (BMI) 37.0-37.9, adult: Secondary | ICD-10-CM | POA: Diagnosis not present

## 2023-05-24 MED ORDER — TIRZEPATIDE-WEIGHT MANAGEMENT 5 MG/0.5ML ~~LOC~~ SOAJ: 5.0000 mg | SUBCUTANEOUS | 0 refills | Status: DC

## 2023-05-24 NOTE — Progress Notes (Signed)
 Office: [phone]  /  Fax: [phone]  Weight Summary And Biometrics  Vitals Temp: 98.2 F (36.8 C) BP: 137/76 Pulse Rate: 97 SpO2: 100 %   Anthropometric Measurements Height: 5\' 7"  (1.702 m) Weight: 229 lb (103.9 kg) BMI (Calculated): 35.86 Weight at Last Visit: 242 lb Weight Lost Since Last Visit: 13 lb Weight Gained Since Last Visit: 0 lb Starting Weight: 238 lb Total Weight Loss (lbs): 9 lb (4.082 kg) Peak Weight: 247 lb   Body Composition  Body Fat %: 45.4 % Fat Mass (lbs): 104.2 lbs Muscle Mass (lbs): 118.8 lbs Total Body Water (lbs): 83.8 lbs Visceral Fat Rating : 13    No data recorded Today's Visit #: 8  Starting Date: 08/17/18   Subjective   Chief Complaint: Obesity  Interval History Discussed the use of AI scribe software for clinical note transcription with the patient, who gave verbal consent to proceed.  History of Present Illness   Mia Oconnell is a 58 year old female with obesity, type 2 diabetes, and hyperlipidemia who presents for medical weight management.  She has lost 13 pounds since her last visit. She follows a dietary plan of 1200 calories and 90 to 100 grams of protein daily, adhering to it 70 to 80% of the time. Her diet includes more whole foods and the recommended protein intake, but she acknowledges inadequate hydration and occasionally skipping meals. She exercises three days a week for about 30 minutes, reports insufficient sleep, and experiences stress.  She is currently taking Zepbound, 5 mg once a week. She experiences general itchiness, which she attributes to possible dehydration or low humidity, and notes feeling colder, a common side effect of the medication. The medication effectively reduces 'food noise and hunger,' leading to reduced portion sizes.  She occasionally skips meals, typically having breakfast and a late afternoon lunch/dinner, supplemented with snacks. She finds it easy to skip meals due to appetite  suppression from the medication. She aims to include balanced snacks with protein and fiber.  She is disappointed due to ongoing foot pain, initially thought to be plantar fasciitis, but now presents as heel pain causing her to walk on the outside of her foot. She has an upcoming foot appointment. Some shoes provide better arch support than others, and she discusses using an arch brace for additional support.  She mentions stress and lack of sleep, exacerbated by a recent incident involving her dog attacking a neighbor's dog, leading to plans to relocate the dog. She hopes to improve her sleep and manage stress over the next four weeks.        Challenges affecting patient progress: low volume of physical activity at present  and orthopedic problems, medical conditions or chronic pain affecting mobility.    Pharmacotherapy for weight management: She is currently taking Zepbound with adequate clinical response  and without side effects..   Assessment and Plan   Treatment Plan For Obesity:  Recommended Dietary Goals  Mia Oconnell is currently in the action stage of change. As such, her goal is to continue weight management plan. She has agreed to: continue current plan  Behavioral Health and Counseling  We discussed the following behavioral modification strategies today: increasing vegetables, increasing fiber rich foods, avoiding skipping meals, and increasing water intake .  Additional education and resources provided today: None  Recommended Physical Activity Goals  Mia Oconnell has been advised to work up to 150 minutes of moderate intensity aerobic activity a week and strengthening exercises 2-3 times per week for  cardiovascular health, weight loss maintenance and preservation of muscle mass.   She has agreed to :  Think about enjoyable ways to increase daily physical activity and overcoming barriers to exercise and Increase physical activity in their day and reduce sedentary time  (increase NEAT).  Pharmacotherapy  We discussed various medication options to help Darilyn with her weight loss efforts and we both agreed to : adequate clinical response to current dose, continue current regimen  Associated Conditions Impacted by Obesity Treatment  Class 2 severe obesity with serious comorbidity and body mass index (BMI) of 37.0 to 37.9 in adult, unspecified obesity type (HCC) -     Tirzepatide-Weight Management; Inject 5 mg into the skin once a week.  Dispense: 2 mL; Refill: 0  Abnormal food appetite -     Tirzepatide-Weight Management; Inject 5 mg into the skin once a week.  Dispense: 2 mL; Refill: 0  Type 2 diabetes mellitus with other specified complication, without long-term current use of insulin (HCC) -     Tirzepatide-Weight Management; Inject 5 mg into the skin once a week.  Dispense: 2 mL; Refill: 0    Assessment and Plan    Obesity /abnormal food appetite Patient presents for medical weight management. She has lost 13 pounds since the last visit, tracking 1200 calories and 90-100 grams of protein per day, following the plan 70-80% of the time. She is incorporating more whole foods but acknowledges inadequate hydration and occasional meal skipping. Exercising three days a week for 30 minutes. Experiencing stress and inadequate sleep. Currently on Zepbound 5 mg once a week, with no nausea but experiencing general itchiness, likely due to low humidity. Reports effective appetite suppression and satiety with the medication. Discussed balanced eating with adequate protein and fiber to prevent muscle loss, emphasizing that rapid weight loss can lead to muscle loss. Informed that Zepbound may cause constipation if fiber and water intake are inadequate.  - Continue Zepbound 5 mg once a week - Encourage adequate hydration - Advise on balanced eating with adequate protein and fiber - Recommend humidifier for itchiness - Encourage intentional meal planning to avoid  restrictive eating patterns - Discuss sleep hygiene and stress management - Follow-up in 4 weeks  Heel Pain Patient reports heel pain, initially thought to be plantar fasciitis, now causing altered gait and pain on the lateral aspect of the foot. She has a podiatry appointment scheduled for Monday. Discussed the use of an arch support compression sleeve for comfort and support. - Recommend arch support compression sleeve - Measure circumference of ankle and arch for proper fit - Follow-up with podiatrist on Monday  Type 2 Diabetes Mellitus Stable no signs or symptoms of hypoglycemia.  Continue Zepbound at current dose.   General Health Maintenance Discussed the importance of regular physical activity, considering low-impact options like chair exercises or aquatic programs. Emphasized balanced nutrition, including healthy fats, protein, and fiber, and avoiding processed carbs and fatty foods to prevent acid reflux. Discussed potential impact of alcohol on digestion with Zepbound. - Encourage regular physical activity, considering low-impact options like chair exercises or aquatic programs - Advise on balanced nutrition, including healthy fats, protein, and fiber - Advise on avoiding processed carbs and fatty foods to prevent acid reflux - Discuss potential impact of alcohol on digestion with Zepbound  Follow-up - Schedule follow-up appointment in 4  weeks.          Objective   Physical Exam:  Blood pressure 137/76, pulse 97, temperature 98.2 F (36.8 C),  height 5\' 7"  (1.702 m), weight 229 lb (103.9 kg), last menstrual period 05/03/2023, SpO2 100%. Body mass index is 35.87 kg/m.  General: She is overweight, cooperative, alert, well developed, and in no acute distress. PSYCH: Has normal mood, affect and thought process.   HEENT: EOMI, sclerae are anicteric. Lungs: Normal breathing effort, no conversational dyspnea. Extremities: No edema.  Neurologic: No gross sensory or motor  deficits. No tremors or fasciculations noted.    Diagnostic Data Reviewed:  BMET    Component Value Date/Time   NA 140 08/17/2022 0839   K 4.1 08/17/2022 0839   CL 102 08/17/2022 0839   CO2 22 08/17/2022 0839   GLUCOSE 115 (H) 08/17/2022 0839   GLUCOSE 98 06/13/2019 0855   BUN 16 08/17/2022 0839   CREATININE 0.63 08/17/2022 0839   CALCIUM 9.2 08/17/2022 0839   Lab Results  Component Value Date   HGBA1C 6.1 (H) 08/17/2022   HGBA1C 6.0 10/18/2016   Lab Results  Component Value Date   INSULIN 6.4 08/17/2022   Lab Results  Component Value Date   TSH 2.130 08/17/2022   CBC    Component Value Date/Time   WBC 7.6 08/17/2022 0839   WBC 7.2 04/24/2019 0801   RBC 4.38 08/17/2022 0839   RBC 4.40 04/24/2019 0801   HGB 13.8 08/17/2022 0839   HCT 40.7 08/17/2022 0839   PLT 201.0 04/24/2019 0801   MCV 93 08/17/2022 0839   MCH 31.5 08/17/2022 0839   MCHC 33.9 08/17/2022 0839   MCHC 34.5 04/24/2019 0801   RDW 12.3 08/17/2022 0839   Iron Studies No results found for: "IRON", "TIBC", "FERRITIN", "IRONPCTSAT" Lipid Panel     Component Value Date/Time   CHOL 161 08/17/2022 0839   TRIG 75 08/17/2022 0839   HDL 73 08/17/2022 0839   CHOLHDL 2 04/24/2019 0801   VLDL 11.2 04/24/2019 0801   LDLCALC 74 08/17/2022 0839   Hepatic Function Panel     Component Value Date/Time   PROT 6.8 08/17/2022 0839   ALBUMIN 4.5 08/17/2022 0839   AST 19 08/17/2022 0839   ALT 16 08/17/2022 0839   ALKPHOS 74 08/17/2022 0839   BILITOT 0.6 08/17/2022 0839      Component Value Date/Time   TSH 2.130 08/17/2022 0839   Nutritional Lab Results  Component Value Date   VD25OH 38.1 08/17/2022   VD25OH 54.44 04/24/2019   VD25OH 28.15 (L) 11/09/2017    Medications: Outpatient Encounter Medications as of 05/24/2023  Medication Sig   amLODipine (NORVASC) 5 MG tablet Take 1 tablet (5 mg total) by mouth daily.   atorvastatin (LIPITOR) 40 MG tablet TAKE 1 40 MG TABLET BY MOUTH EVERYDAY AT 6 PM    Cholecalciferol (VITAMIN D) 125 MCG (5000 UT) CAPS    levocetirizine (XYZAL ALLERGY 24HR) 5 MG tablet    Multiple Vitamins-Minerals (CENTRUM SILVER 50+WOMEN) TABS    Omega-3 Fatty Acids (FISH OIL) 1000 MG CAPS    [DISCONTINUED] tirzepatide (ZEPBOUND) 5 MG/0.5ML Pen Inject 5 mg into the skin once a week.   tirzepatide (ZEPBOUND) 5 MG/0.5ML Pen Inject 5 mg into the skin once a week.   No facility-administered encounter medications on file as of 05/24/2023.     Follow-Up   Return in about 4 weeks (around 06/21/2023) for For Weight Mangement with Dr. Rikki Spearing.Marland Kitchen She was informed of the importance of frequent follow up visits to maximize her success with intensive lifestyle modifications for her multiple health conditions.  Attestation Statement   Reviewed by clinician  on day of visit: allergies, medications, problem list, medical history, surgical history, family history, social history, and previous encounter notes.     Worthy Rancher, MD

## 2023-05-25 DIAGNOSIS — M79672 Pain in left foot: Secondary | ICD-10-CM | POA: Insufficient documentation

## 2023-05-25 DIAGNOSIS — M722 Plantar fascial fibromatosis: Secondary | ICD-10-CM | POA: Insufficient documentation

## 2023-05-29 ENCOUNTER — Ambulatory Visit (INDEPENDENT_AMBULATORY_CARE_PROVIDER_SITE_OTHER): Payer: 59 | Admitting: Podiatry

## 2023-05-29 ENCOUNTER — Encounter: Payer: Self-pay | Admitting: Podiatry

## 2023-05-29 DIAGNOSIS — M722 Plantar fascial fibromatosis: Secondary | ICD-10-CM

## 2023-05-29 MED ORDER — TRIAMCINOLONE ACETONIDE 40 MG/ML IJ SUSP
20.0000 mg | Freq: Once | INTRAMUSCULAR | Status: AC
  Administered 2023-05-29: 20 mg

## 2023-05-29 NOTE — Progress Notes (Signed)
 She presents today possible slight flareup of her fasciitis she was seen by [facility] in early March 2025.  She was prescribed a Medrol Dosepak and placed in a cam.  She states that the outside part of her foot is starting to hurt she thinks it may initially started from the plantar fasciitis but wonders if the outside hurts due to compensation.  Denies any trauma to the foot.  Was seen here last summer for Planter fasciitis of ankle.  Objective: Vital signs stable alert oriented x 3.  Pulses are palpable.  Presents in a cam boot today.  Once removed his right pain on palpation medial and central calcaneal tubercle.  Lateral aspect of the heel is nontender though she does have tenderness on range of motion overlying the fourth fifth tarsometatarsal joint area.  There is no edema drainage or odor no signs of infection.  Assessment: Plantar fasciitis with lateral compensatory syndrome.  Plan: Encouraged her to finish her Dosepak provided her through [facility] and utilize her cam boot.  I injected her heel today 20 mg Kenalog milligrams 1 point palpable tenderness.  Tolerated procedure well without Caucasians will follow-up with me on an as-needed basis.  Will be legal [place] trip May 2025.

## 2023-06-05 ENCOUNTER — Encounter (INDEPENDENT_AMBULATORY_CARE_PROVIDER_SITE_OTHER): Payer: Self-pay | Admitting: Internal Medicine

## 2023-06-05 ENCOUNTER — Other Ambulatory Visit (INDEPENDENT_AMBULATORY_CARE_PROVIDER_SITE_OTHER): Payer: Self-pay | Admitting: Internal Medicine

## 2023-06-05 DIAGNOSIS — E1169 Type 2 diabetes mellitus with other specified complication: Secondary | ICD-10-CM

## 2023-06-05 DIAGNOSIS — R638 Other symptoms and signs concerning food and fluid intake: Secondary | ICD-10-CM

## 2023-06-06 ENCOUNTER — Other Ambulatory Visit (INDEPENDENT_AMBULATORY_CARE_PROVIDER_SITE_OTHER): Payer: Self-pay

## 2023-06-06 DIAGNOSIS — E1169 Type 2 diabetes mellitus with other specified complication: Secondary | ICD-10-CM

## 2023-06-06 DIAGNOSIS — R638 Other symptoms and signs concerning food and fluid intake: Secondary | ICD-10-CM

## 2023-06-06 MED ORDER — TIRZEPATIDE-WEIGHT MANAGEMENT 5 MG/0.5ML ~~LOC~~ SOAJ
5.0000 mg | SUBCUTANEOUS | 0 refills | Status: DC
Start: 2023-06-06 — End: 2023-06-21

## 2023-06-11 ENCOUNTER — Encounter: Payer: Self-pay | Admitting: Podiatry

## 2023-06-12 ENCOUNTER — Ambulatory Visit: Payer: 59 | Admitting: Podiatry

## 2023-06-17 ENCOUNTER — Other Ambulatory Visit (INDEPENDENT_AMBULATORY_CARE_PROVIDER_SITE_OTHER): Payer: Self-pay | Admitting: Internal Medicine

## 2023-06-17 DIAGNOSIS — R638 Other symptoms and signs concerning food and fluid intake: Secondary | ICD-10-CM

## 2023-06-17 DIAGNOSIS — E1169 Type 2 diabetes mellitus with other specified complication: Secondary | ICD-10-CM

## 2023-06-21 ENCOUNTER — Other Ambulatory Visit (INDEPENDENT_AMBULATORY_CARE_PROVIDER_SITE_OTHER): Payer: Self-pay

## 2023-06-21 DIAGNOSIS — R638 Other symptoms and signs concerning food and fluid intake: Secondary | ICD-10-CM

## 2023-06-21 DIAGNOSIS — E66812 Obesity, class 2: Secondary | ICD-10-CM

## 2023-06-21 DIAGNOSIS — E1169 Type 2 diabetes mellitus with other specified complication: Secondary | ICD-10-CM

## 2023-06-21 MED ORDER — TIRZEPATIDE-WEIGHT MANAGEMENT 5 MG/0.5ML ~~LOC~~ SOAJ: 5.0000 mg | SUBCUTANEOUS | 0 refills | Status: DC

## 2023-06-22 ENCOUNTER — Ambulatory Visit (INDEPENDENT_AMBULATORY_CARE_PROVIDER_SITE_OTHER): Payer: 59 | Admitting: Vascular Surgery

## 2023-06-26 ENCOUNTER — Ambulatory Visit: Admitting: Podiatry

## 2023-06-28 ENCOUNTER — Ambulatory Visit: Admitting: Podiatry

## 2023-06-28 ENCOUNTER — Encounter (INDEPENDENT_AMBULATORY_CARE_PROVIDER_SITE_OTHER): Payer: Self-pay | Admitting: Internal Medicine

## 2023-06-28 ENCOUNTER — Ambulatory Visit (INDEPENDENT_AMBULATORY_CARE_PROVIDER_SITE_OTHER): Admitting: Internal Medicine

## 2023-06-28 VITALS — BP 113/77 | HR 83 | Temp 98.2°F | Ht 67.0 in | Wt 224.0 lb

## 2023-06-28 DIAGNOSIS — R638 Other symptoms and signs concerning food and fluid intake: Secondary | ICD-10-CM

## 2023-06-28 DIAGNOSIS — E66812 Obesity, class 2: Secondary | ICD-10-CM | POA: Diagnosis not present

## 2023-06-28 DIAGNOSIS — Z6837 Body mass index (BMI) 37.0-37.9, adult: Secondary | ICD-10-CM | POA: Diagnosis not present

## 2023-06-28 DIAGNOSIS — R948 Abnormal results of function studies of other organs and systems: Secondary | ICD-10-CM

## 2023-06-28 MED ORDER — TIRZEPATIDE-WEIGHT MANAGEMENT 5 MG/0.5ML ~~LOC~~ SOAJ: 5.0000 mg | SUBCUTANEOUS | 1 refills | Status: DC

## 2023-06-28 NOTE — Progress Notes (Signed)
 Office: [phone]  /  Fax: [phone]  Weight Summary And Biometrics  Vitals Temp: 98.2 F (36.8 C) BP: 113/77 Pulse Rate: 83 SpO2: 100 %   Anthropometric Measurements Height: 5\' 7"  (1.702 m) Weight: 224 lb (101.6 kg) BMI (Calculated): 35.08 Weight at Last Visit: 229 lb Weight Lost Since Last Visit: 5 lb Weight Gained Since Last Visit: 0 lb Starting Weight: 238 lb Total Weight Loss (lbs): 14 lb (6.35 kg) Peak Weight: 247 lb   Body Composition  Body Fat %: 47.1 % Fat Mass (lbs): 105.6 lbs Muscle Mass (lbs): 112.8 lbs Total Body Water (lbs): 87 lbs Visceral Fat Rating : 13    No data recorded Today's Visit #: 9  Starting Date: 08/17/18   Subjective   Chief Complaint: Obesity  Interval History Discussed the use of AI scribe software for clinical note transcription with the patient, who gave verbal consent to proceed.  History of Present Illness Mia Oconnell is a 58 year old female who presents for medical weight management.  She has lost five pounds since her last visit, which she considers progress despite being less than the previous month. She has been on a medication regimen since January, with an increase in dosage to 5 mg in February. The medication effectively controls her appetite, sometimes too well, as she often has to force herself to eat due to lack of hunger. No issues with her current medication, denying any nausea or constipation.  She focuses on high-protein, low-sugar foods and is trying to maintain a balanced diet despite her reduced appetite. She mentions having unopened boxes of protein shakes as she has shifted back to more normal foods like grilled chicken salads and cheese pizza. She is conscious of her hydration but admits she could improve, often drinking only when thirsty.  She attributes some of her weight loss challenges to stress and acknowledges that her stress levels have been high. She has recently been able to reduce  stress by sending her dog to live with her husband, which has allowed her to focus more on her health and exercise.  She has started incorporating more physical activity into her routine, using hand weights and planning to increase the frequency of her workouts. She feels her foot is mostly better, allowing her to engage more in exercise. She believes that increasing her physical activity will help her progress further in her weight management journey.  No issues with her medication, including nausea or constipation. She reports good sleep and no significant changes in her overall well-being.    Challenges affecting patient progress: none.    Pharmacotherapy for weight management: She is currently taking Zepbound with adequate clinical response  and without side effects..   Assessment and Plan   Treatment Plan For Obesity:  Recommended Dietary Goals  Mia Oconnell is currently in the action stage of change. As such, her goal is to continue weight management plan. She has agreed to: continue current plan  Behavioral Health and Counseling  We discussed the following behavioral modification strategies today: continue to work on maintaining a reduced calorie state, getting the recommended amount of protein, incorporating whole foods, making healthy choices, staying well hydrated and practicing mindfulness when eating..  Additional education and resources provided today: None  Recommended Physical Activity Goals  Mia Oconnell has been advised to work up to 150 minutes of moderate intensity aerobic activity a week and strengthening exercises 2-3 times per week for cardiovascular health, weight loss maintenance and preservation of muscle mass.  She has agreed to :  continue to gradually increase the amount and intensity of exercise routine  Pharmacotherapy  We discussed various medication options to help Mia Oconnell with her weight loss efforts and we both agreed to : adequate clinical response to  current dose, continue current regimen  Associated Conditions Impacted by Obesity Treatment  Abnormal metabolism  Class 2 severe obesity with serious comorbidity and body mass index (BMI) of 37.0 to 37.9 in adult, unspecified obesity type (HCC) -     Tirzepatide-Weight Management; Inject 5 mg into the skin once a week.  Dispense: 2 mL; Refill: 1  Abnormal food appetite -     Tirzepatide-Weight Management; Inject 5 mg into the skin once a week.  Dispense: 2 mL; Refill: 1    Assessment and Plan Assessment & Plan Obesity Mia Oconnell is undergoing medical weight management and has lost five pounds since the last visit. Her medication regimen effectively controls her appetite, allowing a healthy weight loss rate of 4-5 pounds per month without adverse effects such as nausea or constipation. She prefers to remain on the current maintenance dose. Emphasis was placed on a high-protein, low-sugar diet and the importance of not skipping meals to avoid metabolic slowdown. Hydration and protein intake are crucial to prevent muscle loss and maintain a healthy weight loss rate. A 7-day high-protein meal plan was provided to ensure adequate nutrition. The potential risks of too rapid weight loss, such as muscle loss, were discussed - Continue current medication regimen at the maintenance dose. - Provide a 7-day high-protein meal plan to ensure adequate nutrition. - Encourage consistent hydration and protein intake. - Discuss the option of a vacation supply of medication with insurance due to upcoming travel. - Consider a 90-day supply of medication if she remains well-managed on the current dose.  Exercise and Physical Activity Mia Oconnell has started incorporating physical activity into her routine, including the use of hand weights. She acknowledges the potential benefits of exercise in enhancing metabolic rate and overall well-being. She is motivated to increase the frequency and intensity of her exercise  regimen, particularly focusing on strength training to support her weight management goals. Exercise is expected to aid in calorie burning, improve metabolic rate, and release hormones that assist with insulin resistance and hunger regulation. - Encourage regular physical activity, focusing on strength training with hand weights. - Discuss the benefits of exercise in improving metabolic rate and overall health.  Medication Management Mia Oconnell is concerned about the timing of her medication supply due to upcoming travel. Options for obtaining a vacation supply of medication or switching to a 90-day supply were discussed to ensure continuity of care. She was advised to contact her insurance for a vacation supply and informed that the medication can be kept at room temperature for up to 20 days. - Advise Mia Oconnell to contact her insurance for a vacation supply of medication. - Send in a refill prescription to the pharmacy. - Discuss the possibility of switching to a 90-day supply if she remains well-managed on the current dose.  General Health Maintenance Mia Oconnell is focusing on maintaining a balanced diet with adequate protein intake and hydration. Educational resources, including a high-protein meal plan and an app for tracking meals and calories, were provided. The app features an AI meal scan to assist with tracking nutritional intake. - Provide a 7-day high-protein meal plan. - Introduce Mia Oconnell to an app with AI meal scan for tracking meals and calories. - Encourage consistent hydration and protein intake.  Follow-up Mia Oconnell  is scheduled for an 8-week follow-up appointment to monitor her progress and adjust her treatment plan as necessary. This timeline accommodates her upcoming travel plans and allows for continued monitoring of her weight management regimen. - Schedule follow-up appointment in 8 weeks.      Objective   Physical Exam:  Blood pressure 113/77, pulse 83, temperature 98.2  F (36.8 C), height 5\' 7"  (1.702 m), weight 224 lb (101.6 kg), last menstrual period 05/03/2023, SpO2 100%. Body mass index is 35.08 kg/m.  General: She is overweight, cooperative, alert, well developed, and in no acute distress. PSYCH: Has normal mood, affect and thought process.   HEENT: EOMI, sclerae are anicteric. Lungs: Normal breathing effort, no conversational dyspnea. Extremities: No edema.  Neurologic: No gross sensory or motor deficits. No tremors or fasciculations noted.    Diagnostic Data Reviewed:  BMET    Component Value Date/Time   NA 140 08/17/2022 0839   K 4.1 08/17/2022 0839   CL 102 08/17/2022 0839   CO2 22 08/17/2022 0839   GLUCOSE 115 (H) 08/17/2022 0839   GLUCOSE 98 06/13/2019 0855   BUN 16 08/17/2022 0839   CREATININE 0.63 08/17/2022 0839   CALCIUM 9.2 08/17/2022 0839   Lab Results  Component Value Date   HGBA1C 6.1 (H) 08/17/2022   HGBA1C 6.0 10/18/2016   Lab Results  Component Value Date   INSULIN 6.4 08/17/2022   Lab Results  Component Value Date   TSH 2.130 08/17/2022   CBC    Component Value Date/Time   WBC 7.6 08/17/2022 0839   WBC 7.2 04/24/2019 0801   RBC 4.38 08/17/2022 0839   RBC 4.40 04/24/2019 0801   HGB 13.8 08/17/2022 0839   HCT 40.7 08/17/2022 0839   PLT 201.0 04/24/2019 0801   MCV 93 08/17/2022 0839   MCH 31.5 08/17/2022 0839   MCHC 33.9 08/17/2022 0839   MCHC 34.5 04/24/2019 0801   RDW 12.3 08/17/2022 0839   Iron Studies No results found for: "IRON", "TIBC", "FERRITIN", "IRONPCTSAT" Lipid Panel     Component Value Date/Time   CHOL 161 08/17/2022 0839   TRIG 75 08/17/2022 0839   HDL 73 08/17/2022 0839   CHOLHDL 2 04/24/2019 0801   VLDL 11.2 04/24/2019 0801   LDLCALC 74 08/17/2022 0839   Hepatic Function Panel     Component Value Date/Time   PROT 6.8 08/17/2022 0839   ALBUMIN 4.5 08/17/2022 0839   AST 19 08/17/2022 0839   ALT 16 08/17/2022 0839   ALKPHOS 74 08/17/2022 0839   BILITOT 0.6 08/17/2022 0839       Component Value Date/Time   TSH 2.130 08/17/2022 0839   Nutritional Lab Results  Component Value Date   VD25OH 38.1 08/17/2022   VD25OH 54.44 04/24/2019   VD25OH 28.15 (L) 11/09/2017    Medications: Outpatient Encounter Medications as of 06/28/2023  Medication Sig   amLODipine (NORVASC) 5 MG tablet Take 1 tablet (5 mg total) by mouth daily.   atorvastatin (LIPITOR) 40 MG tablet TAKE 1 40 MG TABLET BY MOUTH EVERYDAY AT 6 PM   Cholecalciferol (VITAMIN D) 125 MCG (5000 UT) CAPS    levocetirizine (XYZAL ALLERGY 24HR) 5 MG tablet    Multiple Vitamins-Minerals (CENTRUM SILVER 50+WOMEN) TABS    Omega-3 Fatty Acids (FISH OIL) 1000 MG CAPS    [DISCONTINUED] tirzepatide (ZEPBOUND) 5 MG/0.5ML Pen Inject 5 mg into the skin once a week.   tirzepatide (ZEPBOUND) 5 MG/0.5ML Pen Inject 5 mg into the skin once a week.   [  DISCONTINUED] methylPREDNISolone (MEDROL DOSEPAK) 4 MG TBPK tablet Take by mouth as directed.   No facility-administered encounter medications on file as of 06/28/2023.     Follow-Up   Return in about 8 weeks (around 08/23/2023) for For Weight Mangement with Dr. Rikki Spearing.Marland Kitchen She was informed of the importance of frequent follow up visits to maximize her success with intensive lifestyle modifications for her multiple health conditions.  Attestation Statement   Reviewed by clinician on day of visit: allergies, medications, problem list, medical history, surgical history, family history, social history, and previous encounter notes.     Worthy Rancher, MD

## 2023-06-29 ENCOUNTER — Other Ambulatory Visit (INDEPENDENT_AMBULATORY_CARE_PROVIDER_SITE_OTHER): Payer: Self-pay

## 2023-07-11 ENCOUNTER — Ambulatory Visit (INDEPENDENT_AMBULATORY_CARE_PROVIDER_SITE_OTHER): Payer: Commercial Managed Care - PPO | Admitting: Family

## 2023-07-11 ENCOUNTER — Encounter: Payer: Self-pay | Admitting: Family

## 2023-07-11 VITALS — BP 118/78 | HR 74 | Temp 98.4°F | Ht 67.0 in | Wt 224.0 lb

## 2023-07-11 DIAGNOSIS — R638 Other symptoms and signs concerning food and fluid intake: Secondary | ICD-10-CM

## 2023-07-11 DIAGNOSIS — Z6837 Body mass index (BMI) 37.0-37.9, adult: Secondary | ICD-10-CM

## 2023-07-11 DIAGNOSIS — I10 Essential (primary) hypertension: Secondary | ICD-10-CM

## 2023-07-11 DIAGNOSIS — E66812 Obesity, class 2: Secondary | ICD-10-CM

## 2023-07-11 DIAGNOSIS — Z0000 Encounter for general adult medical examination without abnormal findings: Secondary | ICD-10-CM | POA: Diagnosis not present

## 2023-07-11 DIAGNOSIS — N951 Menopausal and female climacteric states: Secondary | ICD-10-CM | POA: Diagnosis not present

## 2023-07-11 DIAGNOSIS — E669 Obesity, unspecified: Secondary | ICD-10-CM

## 2023-07-11 DIAGNOSIS — Z1231 Encounter for screening mammogram for malignant neoplasm of breast: Secondary | ICD-10-CM

## 2023-07-11 LAB — CBC WITH DIFFERENTIAL/PLATELET
Basophils Absolute: 0.1 10*3/uL (ref 0.0–0.1)
Basophils Relative: 0.6 % (ref 0.0–3.0)
Eosinophils Absolute: 0.1 10*3/uL (ref 0.0–0.7)
Eosinophils Relative: 1.1 % (ref 0.0–5.0)
HCT: 42.7 % (ref 36.0–46.0)
Hemoglobin: 14.4 g/dL (ref 12.0–15.0)
Lymphocytes Relative: 33.6 % (ref 12.0–46.0)
Lymphs Abs: 2.7 10*3/uL (ref 0.7–4.0)
MCHC: 33.9 g/dL (ref 30.0–36.0)
MCV: 92.1 fl (ref 78.0–100.0)
Monocytes Absolute: 0.7 10*3/uL (ref 0.1–1.0)
Monocytes Relative: 8.2 % (ref 3.0–12.0)
Neutro Abs: 4.6 10*3/uL (ref 1.4–7.7)
Neutrophils Relative %: 56.5 % (ref 43.0–77.0)
Platelets: 227 10*3/uL (ref 150.0–400.0)
RBC: 4.63 Mil/uL (ref 3.87–5.11)
RDW: 13.4 % (ref 11.5–15.5)
WBC: 8.1 10*3/uL (ref 4.0–10.5)

## 2023-07-11 LAB — COMPREHENSIVE METABOLIC PANEL WITH GFR
ALT: 17 U/L (ref 0–35)
AST: 18 U/L (ref 0–37)
Albumin: 4.7 g/dL (ref 3.5–5.2)
Alkaline Phosphatase: 63 U/L (ref 39–117)
BUN: 13 mg/dL (ref 6–23)
CO2: 28 meq/L (ref 19–32)
Calcium: 10 mg/dL (ref 8.4–10.5)
Chloride: 104 meq/L (ref 96–112)
Creatinine, Ser: 0.72 mg/dL (ref 0.40–1.20)
GFR: 93.64 mL/min (ref >60.00)
Glucose, Bld: 101 mg/dL — ABNORMAL HIGH (ref 70–99)
Potassium: 4.5 meq/L (ref 3.5–5.1)
Sodium: 141 meq/L (ref 135–145)
Total Bilirubin: 0.7 mg/dL (ref 0.2–1.2)
Total Protein: 7.3 g/dL (ref 6.0–8.3)

## 2023-07-11 LAB — HEMOGLOBIN A1C: Hgb A1c MFr Bld: 5.9 % (ref 4.6–6.5)

## 2023-07-11 LAB — LIPID PANEL
Cholesterol: 146 mg/dL (ref 0–200)
HDL: 67.8 mg/dL (ref >39.00)
LDL Cholesterol: 66 mg/dL (ref 0–99)
NonHDL: 77.73
Total CHOL/HDL Ratio: 2
Triglycerides: 60 mg/dL (ref 0.0–149.0)
VLDL: 12 mg/dL (ref 0.0–40.0)

## 2023-07-11 LAB — VITAMIN D 25 HYDROXY (VIT D DEFICIENCY, FRACTURES): VITD: 40.58 ng/mL (ref 30.00–100.00)

## 2023-07-11 LAB — TSH: TSH: 1.74 u[IU]/mL (ref 0.35–5.50)

## 2023-07-11 MED ORDER — TIRZEPATIDE-WEIGHT MANAGEMENT 5 MG/0.5ML ~~LOC~~ SOAJ: 5.0000 mg | SUBCUTANEOUS | 1 refills | Status: DC

## 2023-07-11 NOTE — Assessment & Plan Note (Signed)
 Episodic. Discussed natural remedies including layered dressing, fan, and OTC black cohosh. She will let me know how she is doing.

## 2023-07-11 NOTE — Patient Instructions (Addendum)
 May trial simply [facility] for hot flashes  Alternatives we use are Paxil, gabapentin.   Let me know how you are doing    Health Maintenance for Postmenopausal Women Menopause is a normal process in which your ability to get pregnant comes to an end. This process happens slowly over many months or years, usually between the ages of 63 and 21. Menopause is complete when you have missed your menstrual period for 12 months. It is important to talk with your health care provider about some of the most common conditions that affect women after menopause (postmenopausal women). These include heart disease, cancer, and bone loss (osteoporosis). Adopting a healthy lifestyle and getting preventive care can help to promote your health and wellness. The actions you take can also lower your chances of developing some of these common conditions. What are the signs and symptoms of menopause? During menopause, you may have the following symptoms: Hot flashes. These can be moderate or severe. Night sweats. Decrease in sex drive. Mood swings. Headaches. Tiredness (fatigue). Irritability. Memory problems. Problems falling asleep or staying asleep. Talk with your health care provider about treatment options for your symptoms. Do I need hormone replacement therapy? Hormone replacement therapy is effective in treating symptoms that are caused by menopause, such as hot flashes and night sweats. Hormone replacement carries certain risks, especially as you become older. If you are thinking about using estrogen or estrogen with progestin, discuss the benefits and risks with your health care provider. How can I reduce my risk for heart disease and stroke? The risk of heart disease, heart attack, and stroke increases as you age. One of the causes may be a change in the body's hormones during menopause. This can affect how your body uses dietary fats, triglycerides, and cholesterol. Heart attack and stroke are  medical emergencies. There are many things that you can do to help prevent heart disease and stroke. Watch your blood pressure High blood pressure causes heart disease and increases the risk of stroke. This is more likely to develop in people who have high blood pressure readings or are overweight. Have your blood pressure checked: Every 3-5 years if you are 84-64 years of age. Every year if you are 43 years old or older. Eat a healthy diet  Eat a diet that includes plenty of vegetables, fruits, low-fat dairy products, and lean protein. Do not eat a lot of foods that are high in solid fats, added sugars, or sodium. Get regular exercise Get regular exercise. This is one of the most important things you can do for your health. Most adults should: Try to exercise for at least 150 minutes each week. The exercise should increase your heart rate and make you sweat (moderate-intensity exercise). Try to do strengthening exercises at least twice each week. Do these in addition to the moderate-intensity exercise. Spend less time sitting. Even light physical activity can be beneficial. Other tips Work with your health care provider to achieve or maintain a healthy weight. Do not use any products that contain nicotine or tobacco. These products include cigarettes, chewing tobacco, and vaping devices, such as e-cigarettes. If you need help quitting, ask your health care provider. Know your numbers. Ask your health care provider to check your cholesterol and your blood sugar (glucose). Continue to have your blood tested as directed by your health care provider. Do I need screening for cancer? Depending on your health history and family history, you may need to have cancer screenings at different stages  of your life. This may include screening for: Breast cancer. Cervical cancer. Lung cancer. Colorectal cancer. What is my risk for osteoporosis? After menopause, you may be at increased risk for  osteoporosis. Osteoporosis is a condition in which bone destruction happens more quickly than new bone creation. To help prevent osteoporosis or the bone fractures that can happen because of osteoporosis, you may take the following actions: If you are 77-16 years old, get at least 1,000 mg of calcium  and at least 600 international units (IU) of vitamin D  per day. If you are older than age 58 but younger than age 84, get at least 1,200 mg of calcium  and at least 600 international units (IU) of vitamin D  per day. If you are older than age 69, get at least 1,200 mg of calcium  and at least 800 international units (IU) of vitamin D  per day. Smoking and drinking excessive alcohol increase the risk of osteoporosis. Eat foods that are rich in calcium  and vitamin D , and do weight-bearing exercises several times each week as directed by your health care provider. How does menopause affect my mental health? Depression may occur at any age, but it is more common as you become older. Common symptoms of depression include: Feeling depressed. Changes in sleep patterns. Changes in appetite or eating patterns. Feeling an overall lack of motivation or enjoyment of activities that you previously enjoyed. Frequent crying spells. Talk with your health care provider if you think that you are experiencing any of these symptoms. General instructions See your health care provider for regular wellness exams and vaccines. This may include: Scheduling regular health, dental, and eye exams. Getting and maintaining your vaccines. These include: Influenza vaccine. Get this vaccine each year before the flu season begins. Pneumonia vaccine. Shingles vaccine. Tetanus, diphtheria, and pertussis (Tdap) booster vaccine. Your health care provider may also recommend other immunizations. Tell your health care provider if you have ever been abused or do not feel safe at home. Summary Menopause is a normal process in which your  ability to get pregnant comes to an end. This condition causes hot flashes, night sweats, decreased interest in sex, mood swings, headaches, or lack of sleep. Treatment for this condition may include hormone replacement therapy. Take actions to keep yourself healthy, including exercising regularly, eating a healthy diet, watching your weight, and checking your blood pressure and blood sugar levels. Get screened for cancer and depression. Make sure that you are up to date with all your vaccines. This information is not intended to replace advice given to you by your health care provider. Make sure you discuss any questions you have with your health care provider. Document Revised: 07/27/2020 Document Reviewed: 07/27/2020 Elsevier Patient Education  2024 [employer].

## 2023-07-11 NOTE — Progress Notes (Signed)
 Assessment & Plan:  Routine physical examination -     VITAMIN D  25 Hydroxy (Vit-D Deficiency, Fractures) -     Hemoglobin A1c -     CBC with Differential/Platelet -     Comprehensive metabolic panel with GFR -     Lipid panel -     TSH  Class 2 severe obesity with serious comorbidity and body mass index (BMI) of 37.0 to 37.9 in adult, unspecified obesity type (HCC)  Abnormal food appetite  Encounter for screening mammogram for malignant neoplasm of breast -     3D Screening Mammogram, Left and Right; Future  Vasomotor symptoms due to menopause Assessment & Plan: Episodic. Discussed natural remedies including layered dressing, fan, and OTC black cohosh. She will let me know how she is doing.    Annual physical exam Assessment & Plan: Deferred CBE d/t patient preference. She will schedule annual mammogram. Deferred pelvic exam in the absence of complaints and pap smear is UTD.  Encouraged walking.    Obesity (BMI 35.0-39.9 without comorbidity) Assessment & Plan: Chronic, stable. Continue Zepbound . Discussed constipation and she will monitor. Advised miralax.   Orders: -     Tirzepatide -Weight Management; Inject 5 mg into the skin once a week.  Dispense: 2 mL; Refill: 1 -     Hemoglobin A1c  Hypertension, unspecified type Assessment & Plan: Chronic, stable. Continue amlodipine  5ng every day.   Orders: -     Comprehensive metabolic panel with GFR -     TSH     Return precautions given.   Risks, benefits, and alternatives of the medications and treatment plan prescribed today were discussed, and patient expressed understanding.   Education regarding symptom management and diagnosis given to patient on AVS either electronically or printed.  Return for Complete Physical Exam.  Bascom Bossier, FNP  Subjective:    Patient ID: Mia Oconnell, female    DOB: 1966/02/25, 58 y.o.   MRN: [id number]  CC: Mia Oconnell is a 58 y.o. female who presents today for  physical exam and medication follow up.    HPI: Overall feels well today  She has started to have have episodic hot flashes which are not overly bothersome.   She continues to have menses, last menses couple of months ago.   Mother went into menopause in her 39's.   No unusual weight loss.   Doing well on zepbound . H/o constipation however unchanged on zepbound . She is pleased with medication. Denies nausea.    Compliant with amlodipine  5mg  every day.   Colorectal Cancer Screening: UTD , repeat in 10 years Breast Cancer Screening: Mammogram UTD, 07/21/22 Cervical Cancer Screening: UTD , 09/06/22, negative HPV, malignancy   Lung Cancer Screening: Doesn't have 20 year pack year history and age > 74 years yo 58 years        Tetanus - UTD         Exercise: Gets regular exercise , walking, hand weights.  Alcohol use:  none Smoking/tobacco use: Nonsmoker.    Health Maintenance  Topic Date Due   Complete foot exam   11/02/2018   Zoster (Shingles) Vaccine (2 of 2) 01/16/2023   Hemoglobin A1C  02/17/2023   Pneumococcal Vaccination (1 of 2 - PCV) 07/10/2024*   Yearly kidney function blood test for diabetes  08/17/2023   Yearly kidney health urinalysis for diabetes  08/17/2023   Flu Shot  10/20/2023   Eye exam for diabetics  03/06/2024   Mammogram  07/20/2024   DTaP/Tdap/Td vaccine (  2 - Td or Tdap) 08/20/2026   Colon Cancer Screening  09/05/2027   Pap with HPV screening  09/06/2027   COVID-19 Vaccine  Completed   Hepatitis C Screening  Completed   HPV Vaccine  Aged Out   Meningitis B Vaccine  Aged Out   HIV Screening  Discontinued  *Topic was postponed. The date shown is not the original due date.    ALLERGIES: Lisinopril, Penicillins, and Sulfa antibiotics  Current Outpatient Medications on File Prior to Visit  Medication Sig Dispense Refill   amLODipine  (NORVASC ) 5 MG tablet Take 1 tablet (5 mg total) by mouth daily. 90 tablet 3   atorvastatin  (LIPITOR) 40 MG tablet TAKE  1 40 MG TABLET BY MOUTH EVERYDAY AT 6 PM 90 tablet 3   Cholecalciferol (VITAMIN D ) 125 MCG (5000 UT) CAPS      levocetirizine (XYZAL ALLERGY 24HR) 5 MG tablet      Multiple Vitamins-Minerals (CENTRUM SILVER 50+WOMEN) TABS      Omega-3 Fatty Acids (FISH OIL) 1000 MG CAPS      No current facility-administered medications on file prior to visit.    Review of Systems  Constitutional:  Negative for chills, fever and unexpected weight change.  HENT:  Negative for congestion.   Respiratory:  Negative for cough.   Cardiovascular:  Negative for chest pain, palpitations and leg swelling.  Gastrointestinal:  Negative for nausea and vomiting.  Musculoskeletal:  Negative for arthralgias and myalgias.  Skin:  Negative for rash.  Neurological:  Negative for headaches.  Hematological:  Negative for adenopathy.  Psychiatric/Behavioral:  Negative for confusion.       Objective:    BP 118/78   Pulse 74   Temp 98.4 F (36.9 C) (Oral)   Ht 5\' 7"  (1.702 m)   Wt 224 lb (101.6 kg)   LMP 05/03/2023   SpO2 99%   BMI 35.08 kg/m   BP Readings from Last 3 Encounters:  07/11/23 118/78  06/28/23 113/77  05/24/23 137/76   Wt Readings from Last 3 Encounters:  07/11/23 224 lb (101.6 kg)  06/28/23 224 lb (101.6 kg)  05/24/23 229 lb (103.9 kg)    Physical Exam Vitals reviewed.  Constitutional:      Appearance: Normal appearance. She is well-developed.  Eyes:     Conjunctiva/sclera: Conjunctivae normal.  Neck:     Thyroid : No thyroid  mass or thyromegaly.  Cardiovascular:     Rate and Rhythm: Normal rate and regular rhythm.     Pulses: Normal pulses.     Heart sounds: Normal heart sounds.  Pulmonary:     Effort: Pulmonary effort is normal.     Breath sounds: Normal breath sounds. No wheezing, rhonchi or rales.  Abdominal:     General: Bowel sounds are normal. There is no distension.     Palpations: Abdomen is soft. Abdomen is not rigid. There is no fluid wave or mass.     Tenderness: There  is no abdominal tenderness. There is no guarding or rebound.  Lymphadenopathy:     Head:     Right side of head: No submental, submandibular, tonsillar, preauricular, posterior auricular or occipital adenopathy.     Left side of head: No submental, submandibular, tonsillar, preauricular, posterior auricular or occipital adenopathy.     Cervical: No cervical adenopathy.  Skin:    General: Skin is warm and dry.  Neurological:     Mental Status: She is alert.  Psychiatric:        Speech: Speech normal.  Behavior: Behavior normal.        Thought Content: Thought content normal.

## 2023-07-11 NOTE — Assessment & Plan Note (Signed)
 Deferred CBE d/t patient preference. She will schedule annual mammogram. Deferred pelvic exam in the absence of complaints and pap smear is UTD.  Encouraged walking.

## 2023-07-11 NOTE — Assessment & Plan Note (Signed)
 Chronic, stable. Continue amlodipine  5ng every day.

## 2023-07-11 NOTE — Assessment & Plan Note (Signed)
 Chronic, stable. Continue Zepbound . Discussed constipation and she will monitor. Advised miralax.

## 2023-07-15 ENCOUNTER — Encounter: Payer: Self-pay | Admitting: Family

## 2023-07-25 ENCOUNTER — Encounter: Payer: Commercial Managed Care - PPO | Admitting: Family

## 2023-08-08 ENCOUNTER — Encounter (INDEPENDENT_AMBULATORY_CARE_PROVIDER_SITE_OTHER): Payer: Self-pay

## 2023-08-15 ENCOUNTER — Encounter: Payer: Self-pay | Admitting: Family

## 2023-08-15 LAB — HM MAMMOGRAPHY

## 2023-08-22 ENCOUNTER — Encounter: Payer: Self-pay | Admitting: Podiatry

## 2023-08-23 ENCOUNTER — Encounter (INDEPENDENT_AMBULATORY_CARE_PROVIDER_SITE_OTHER): Payer: Self-pay | Admitting: Internal Medicine

## 2023-08-23 ENCOUNTER — Ambulatory Visit: Admitting: Podiatry

## 2023-08-23 ENCOUNTER — Ambulatory Visit (INDEPENDENT_AMBULATORY_CARE_PROVIDER_SITE_OTHER): Admitting: Internal Medicine

## 2023-08-23 VITALS — BP 116/79 | HR 79 | Temp 98.8°F | Ht 67.0 in | Wt 216.0 lb

## 2023-08-23 DIAGNOSIS — E66812 Obesity, class 2: Secondary | ICD-10-CM | POA: Diagnosis not present

## 2023-08-23 DIAGNOSIS — R948 Abnormal results of function studies of other organs and systems: Secondary | ICD-10-CM

## 2023-08-23 DIAGNOSIS — R638 Other symptoms and signs concerning food and fluid intake: Secondary | ICD-10-CM

## 2023-08-23 DIAGNOSIS — I10 Essential (primary) hypertension: Secondary | ICD-10-CM

## 2023-08-23 DIAGNOSIS — Z6833 Body mass index (BMI) 33.0-33.9, adult: Secondary | ICD-10-CM

## 2023-08-23 MED ORDER — TIRZEPATIDE-WEIGHT MANAGEMENT 5 MG/0.5ML ~~LOC~~ SOAJ: 5.0000 mg | SUBCUTANEOUS | 1 refills | Status: DC

## 2023-08-23 NOTE — Assessment & Plan Note (Signed)
 Mia Oconnell is undergoing medical weight management and has lost 8 pounds since the last visit, totaling a 22-pound weight loss. She adheres to a 1200-calorie meal plan 75% of the time and exercises five days a week.  Zepbound  effectively controls hunger without significant side effects. Her weight loss rate is healthy, with a reduction in body fat percentage to 44% and no muscle mass loss. Increasing the medication dose may lead to restrictive eating patterns and muscle loss. Emphasis was placed on maintaining a balanced diet with whole foods and regular exercise. - Continue current medication dose with one refill - Encourage tracking of food intake and maintaining a balanced diet with whole foods - Encourage regular exercise, including strength and cardio - Schedule a follow-up appointment in one month

## 2023-08-23 NOTE — Assessment & Plan Note (Signed)
 Patient has a slower than predicted metabolism. IC 1685 vs. calculated 1807. This may contribute to weight gain, chronic fatigue and difficulty losing weight.   I am proud of Mia Oconnell as she has been increasing volume of physical activity and will be partnering with a fitness instructor despite not enjoying exercise.

## 2023-08-23 NOTE — Assessment & Plan Note (Signed)
 Blood pressure control has improved.  She is currently on amlodipine .  She has lost 22 pounds and is on Zepbound  so anticipate further reductions.  She may be able to come off the medication eventually.  Continue current regimen

## 2023-08-23 NOTE — Progress Notes (Signed)
 Office: [phone]  /  Fax: [phone]  Weight Summary And Biometrics  Vitals Temp: 98.8 F (37.1 C) BP: 116/79 Pulse Rate: 79 SpO2: 99 %   Anthropometric Measurements Height: 5\' 7"  (1.702 m) Weight: 216 lb (98 kg) BMI (Calculated): 33.82 Weight at Last Visit: 224 lb Weight Lost Since Last Visit: 8 lb Weight Gained Since Last Visit: 0 lb Starting Weight: 238 lb Total Weight Loss (lbs): 22 lb (9.979 kg) Peak Weight: 247 lb   Body Composition  Body Fat %: 44.7 % Fat Mass (lbs): 96.6 lbs Muscle Mass (lbs): 113.4 lbs Total Body Water (lbs): 80.6 lbs Visceral Fat Rating : 12    RMR: 1685  Today's Visit #: 10  Starting Date: 08/17/22   Subjective   Chief Complaint: Obesity  Interval History Discussed the use of AI scribe software for clinical note transcription with the patient, who gave verbal consent to proceed.  History of Present Illness   Mia Oconnell is a 58 year old female who presents for medical weight management.  She has lost eight pounds since her last office visit. She follows a 1200 calorie meal plan approximately 75% of the time but is not tracking her intake. She consumes more whole foods but feels she is not getting the recommended amount of protein. She maintains adequate hydration and occasionally skips meals.  She exercises five days a week for about 30 minutes, incorporating both strength and cardio exercises. Despite not enjoying exercise, she continues to engage in physical activity. Her clothes are getting looser.  She was on vacation for 2 weeks during the interval between appointments and consumed alcohol during this time. She feels that her weight loss was slower than expected, but acknowledges that the medication is working as her hunger as not returned. She is currently without medication as she used her last dose on Sunday.  She experiences some constipation, which was present before starting the medication. She is considering  whether to increase her medication dose but feels it is still effective at the current level.  She has a free trial appointment with a personal trainer specializing in middle-aged women scheduled for next week. She is exploring this option to support her weight management efforts.  No significant issues with hunger or snacking between meals. Reports some constipation but no other significant side effects from her medication.       Challenges affecting patient progress: none.    Pharmacotherapy for weight management: She is currently taking Zepbound  with adequate clinical response  and experiencing the following side effects: Occasional but manageable constipation..   Assessment and Plan   Treatment Plan For Obesity:  Recommended Dietary Goals  Mia Oconnell is currently in the action stage of change. As such, her goal is to continue weight management plan. She has agreed to: continue current plan  Behavioral Health and Counseling  We discussed the following behavioral modification strategies today: continue to work on maintaining a reduced calorie state, getting the recommended amount of protein, incorporating whole foods, making healthy choices, staying well hydrated and practicing mindfulness when eating..  Additional education and resources provided today: None  Recommended Physical Activity Goals  Ashanty has been advised to work up to 150 minutes of moderate intensity aerobic activity a week and strengthening exercises 2-3 times per week for cardiovascular health, weight loss maintenance and preservation of muscle mass.   She has agreed to :  continue to gradually increase the amount and intensity of exercise routine  Pharmacotherapy  We discussed various  medication options to help Shamecka with her weight loss efforts and we both agreed to : Adequate clinical response to anti-obesity medication, continue current regimen and do not recommend further increases in GLP-1 due to  adequate clinical response   Associated Conditions Impacted by Obesity Treatment  Class 2 severe obesity with serious comorbidity and body mass index (BMI) of 37.0 to 37.9 in adult, unspecified obesity type [facility]) Assessment & Plan: Mia Oconnell is undergoing medical weight management and has lost 8 pounds since the last visit, totaling a 22-pound weight loss. She adheres to a 1200-calorie meal plan 75% of the time and exercises five days a week.  Zepbound  effectively controls hunger without significant side effects. Her weight loss rate is healthy, with a reduction in body fat percentage to 44% and no muscle mass loss. Increasing the medication dose may lead to restrictive eating patterns and muscle loss. Emphasis was placed on maintaining a balanced diet with whole foods and regular exercise. - Continue current medication dose with one refill - Encourage tracking of food intake and maintaining a balanced diet with whole foods - Encourage regular exercise, including strength and cardio - Schedule a follow-up appointment in one month  Orders: -     Tirzepatide -Weight Management; Inject 5 mg into the skin once a week.  Dispense: 2 mL; Refill: 1  Abnormal metabolism Assessment & Plan: Patient has a slower than predicted metabolism. IC 1685 vs. calculated 1807. This may contribute to weight gain, chronic fatigue and difficulty losing weight.   I am proud of Mia Oconnell as she has been increasing volume of physical activity and will be partnering with a fitness instructor despite not enjoying exercise.    Abnormal food appetite Assessment & Plan: Improved on Zepbound  She has increased orixegenic signaling, impaired satiety and inhibitory control. This is secondary to an abnormal energy regulation system and pathological neurohormonal pathways characteristic of excess adiposity.  She will continue Zepbound  at current dose.  Do not recommend further increases.    Hypertension, unspecified  type Assessment & Plan: Blood pressure control has improved.  She is currently on amlodipine .  She has lost 22 pounds and is on Zepbound  so anticipate further reductions.  She may be able to come off the medication eventually.  Continue current regimen             Objective   Physical Exam:  Blood pressure 116/79, pulse 79, temperature 98.8 F (37.1 C), height 5\' 7"  (1.702 m), weight 216 lb (98 kg), last menstrual period 04/25/2023, SpO2 99%. Body mass index is 33.83 kg/m.  General: She is overweight, cooperative, alert, well developed, and in no acute distress. PSYCH: Has normal mood, affect and thought process.   HEENT: EOMI, sclerae are anicteric. Lungs: Normal breathing effort, no conversational dyspnea. Extremities: No edema.  Neurologic: No gross sensory or motor deficits. No tremors or fasciculations noted.    Diagnostic Data Reviewed:  BMET    Component Value Date/Time   NA 141 07/11/2023 0905   NA 140 08/17/2022 0839   K 4.5 07/11/2023 0905   CL 104 07/11/2023 0905   CO2 28 07/11/2023 0905   GLUCOSE 101 (H) 07/11/2023 0905   BUN 13 07/11/2023 0905   BUN 16 08/17/2022 0839   CREATININE 0.72 07/11/2023 0905   CALCIUM  10.0 07/11/2023 0905   Lab Results  Component Value Date   HGBA1C 5.9 07/11/2023   HGBA1C 6.0 10/18/2016   Lab Results  Component Value Date   INSULIN  6.4 08/17/2022   Lab  Results  Component Value Date   TSH 1.74 07/11/2023   CBC    Component Value Date/Time   WBC 8.1 07/11/2023 0905   RBC 4.63 07/11/2023 0905   HGB 14.4 07/11/2023 0905   HGB 13.8 08/17/2022 0839   HCT 42.7 07/11/2023 0905   HCT 40.7 08/17/2022 0839   PLT 227.0 07/11/2023 0905   MCV 92.1 07/11/2023 0905   MCV 93 08/17/2022 0839   MCH 31.5 08/17/2022 0839   MCHC 33.9 07/11/2023 0905   RDW 13.4 07/11/2023 0905   RDW 12.3 08/17/2022 0839   Iron Studies No results found for: "IRON", "TIBC", "FERRITIN", "IRONPCTSAT" Lipid Panel     Component Value Date/Time    CHOL 146 07/11/2023 0905   CHOL 161 08/17/2022 0839   TRIG 60.0 07/11/2023 0905   HDL 67.80 07/11/2023 0905   HDL 73 08/17/2022 0839   CHOLHDL 2 07/11/2023 0905   VLDL 12.0 07/11/2023 0905   LDLCALC 66 07/11/2023 0905   LDLCALC 74 08/17/2022 0839   Hepatic Function Panel     Component Value Date/Time   PROT 7.3 07/11/2023 0905   PROT 6.8 08/17/2022 0839   ALBUMIN 4.7 07/11/2023 0905   ALBUMIN 4.5 08/17/2022 0839   AST 18 07/11/2023 0905   ALT 17 07/11/2023 0905   ALKPHOS 63 07/11/2023 0905   BILITOT 0.7 07/11/2023 0905   BILITOT 0.6 08/17/2022 0839      Component Value Date/Time   TSH 1.74 07/11/2023 0905   Nutritional Lab Results  Component Value Date   VD25OH 40.58 07/11/2023   VD25OH 38.1 08/17/2022   VD25OH 54.44 04/24/2019    Medications: Outpatient Encounter Medications as of 08/23/2023  Medication Sig   amLODipine  (NORVASC ) 5 MG tablet Take 1 tablet (5 mg total) by mouth daily.   atorvastatin  (LIPITOR) 40 MG tablet TAKE 1 40 MG TABLET BY MOUTH EVERYDAY AT 6 PM   Cholecalciferol (VITAMIN D ) 125 MCG (5000 UT) CAPS    levocetirizine (XYZAL ALLERGY 24HR) 5 MG tablet    Multiple Vitamins-Minerals (CENTRUM SILVER 50+WOMEN) TABS    Omega-3 Fatty Acids (FISH OIL) 1000 MG CAPS    [DISCONTINUED] tirzepatide  (ZEPBOUND ) 5 MG/0.5ML Pen Inject 5 mg into the skin once a week.   tirzepatide  (ZEPBOUND ) 5 MG/0.5ML Pen Inject 5 mg into the skin once a week.   No facility-administered encounter medications on file as of 08/23/2023.     Follow-Up   Return in about 4 weeks (around 09/20/2023) for For Weight Mangement with Dr. Allie Area.Aaron Aas She was informed of the importance of frequent follow up visits to maximize her success with intensive lifestyle modifications for her multiple health conditions.  Attestation Statement   Reviewed by clinician on day of visit: allergies, medications, problem list, medical history, surgical history, family history, social history, and previous  encounter notes.     Ladd Picker, MD

## 2023-08-23 NOTE — Assessment & Plan Note (Signed)
 Improved on Zepbound  She has increased orixegenic signaling, impaired satiety and inhibitory control. This is secondary to an abnormal energy regulation system and pathological neurohormonal pathways characteristic of excess adiposity.  She will continue Zepbound  at current dose.  Do not recommend further increases.

## 2023-09-18 ENCOUNTER — Encounter: Payer: Self-pay | Admitting: Podiatry

## 2023-09-18 ENCOUNTER — Ambulatory Visit (INDEPENDENT_AMBULATORY_CARE_PROVIDER_SITE_OTHER): Admitting: Podiatry

## 2023-09-18 DIAGNOSIS — M19072 Primary osteoarthritis, left ankle and foot: Secondary | ICD-10-CM

## 2023-09-18 MED ORDER — TRIAMCINOLONE ACETONIDE 40 MG/ML IJ SUSP
20.0000 mg | Freq: Once | INTRAMUSCULAR | Status: AC
  Administered 2023-09-18: 20 mg

## 2023-09-18 NOTE — Progress Notes (Signed)
 She presents today stating that the shot really helped her last time she was in back in March she states that the heel is still feeling pretty good but I still hurt over here she points to the sinus tarsi area of the left foot.  Objective: Vital signs are stable alert and oriented x 3.  She has pain on palpation of the sinus tarsi and pain on end range of motion of the subtalar joint consistent with subtalar joint capsulitis.  Assessment: Subtalar joint capsulitis resolving plantar fasciitis.  Plan: Injected the subtalar joint today with 20 mg of Kenalog  5 mg Marcaine to the point of maximal tenderness.  I would like to follow-up with her on an as-needed basis she will call with questions or concerns.

## 2023-10-02 ENCOUNTER — Encounter (INDEPENDENT_AMBULATORY_CARE_PROVIDER_SITE_OTHER): Payer: Self-pay | Admitting: Internal Medicine

## 2023-10-02 ENCOUNTER — Ambulatory Visit (INDEPENDENT_AMBULATORY_CARE_PROVIDER_SITE_OTHER): Admitting: Internal Medicine

## 2023-10-02 VITALS — BP 122/80 | HR 72 | Temp 97.8°F | Ht 67.0 in | Wt 211.0 lb

## 2023-10-02 DIAGNOSIS — R638 Other symptoms and signs concerning food and fluid intake: Secondary | ICD-10-CM

## 2023-10-02 DIAGNOSIS — I10 Essential (primary) hypertension: Secondary | ICD-10-CM

## 2023-10-02 DIAGNOSIS — E66812 Obesity, class 2: Secondary | ICD-10-CM | POA: Diagnosis not present

## 2023-10-02 DIAGNOSIS — R948 Abnormal results of function studies of other organs and systems: Secondary | ICD-10-CM | POA: Diagnosis not present

## 2023-10-02 DIAGNOSIS — Z6837 Body mass index (BMI) 37.0-37.9, adult: Secondary | ICD-10-CM

## 2023-10-02 MED ORDER — TIRZEPATIDE-WEIGHT MANAGEMENT 5 MG/0.5ML ~~LOC~~ SOAJ: 5.0000 mg | SUBCUTANEOUS | 1 refills | Status: DC

## 2023-10-02 NOTE — Assessment & Plan Note (Signed)
 Patient has a slower than predicted metabolism. IC 1685 vs. calculated 1807. This may contribute to weight gain, chronic fatigue and difficulty losing weight.   She has started to do physical activity and is enjoying Pilates.

## 2023-10-02 NOTE — Assessment & Plan Note (Signed)
 She is undergoing medical weight management with a 27-pound weight loss since treatment initiation. Body fat percentage decreased from 47.9% to 44%, and visceral fat rating improved from 14 to 12. Currently on Zepbound  5 mg, achieving a 12% weight loss. Reports medication still suppresses hunger but can now finish meals, indicating potential decrease in efficacy. Prefers to minimize medication use and is not experiencing significant side effects. Emphasized importance of gradual weight loss to maintain muscle mass and achieve long-term success. Outcome statistics indicate 10-15% weight loss on 5 mg, 20% on 10 mg, and 22% on 15 mg. Decision made to maintain current dose for another month to monitor progress, as weight loss has not plateaued. - Continue Zepbound  5 mg for another month - Monitor weight loss progress - Consider increasing Zepbound  to 7.5 mg if weight loss rate slows to 1-2 pounds per month

## 2023-10-02 NOTE — Assessment & Plan Note (Signed)
 Improved on Zepbound  but is noticing increase in portions.  Current dose may be losing effectiveness.  She has increased orixegenic signaling, impaired satiety and inhibitory control. This is secondary to an abnormal energy regulation system and pathological neurohormonal pathways characteristic of excess adiposity.  Consider increasing medication at the next office visit

## 2023-10-02 NOTE — Progress Notes (Signed)
 Office: [phone]  /  Fax: [phone]  Weight Summary and Body Composition Analysis (BIA)  Vitals Temp: 97.8 F (36.6 C) BP: 122/80 Pulse Rate: 72 SpO2: 99 %   Anthropometric Measurements Height: 5' 7 (1.702 m) Weight: 211 lb (95.7 kg) BMI (Calculated): 33.04 Weight at Last Visit: 216 lb Weight Lost Since Last Visit: 5 lb Weight Gained Since Last Visit: 0 lb Starting Weight: 238 lb Total Weight Loss (lbs): 27 lb (12.2 kg) Peak Weight: 247 lb   Body Composition  Body Fat %: 44.4 % Fat Mass (lbs): 94 lbs Muscle Mass (lbs): 111.6 lbs Total Body Water (lbs): 82.4 lbs Visceral Fat Rating : 12    RMR: 1685  Today's Visit #: 11  Starting Date: 08/17/22   Subjective   Chief Complaint: Obesity  Interval History Discussed the use of AI scribe software for clinical note transcription with the patient, who gave verbal consent to proceed.  History of Present Illness   Mia Oconnell is a 58 year old female who presents for medical weight management.  She has lost a total of 27 pounds since starting treatment, with her body fat percentage decreasing from 47.9% to 44% and her visceral fat rating improving from 14 to 12. She follows a 1200 calorie meal plan approximately 70% of the time and has recently lost five pounds since her last office visit.  She engages in physical activity, including four sessions with a personal trainer and weekly Pilates classes. Initially, the training sessions caused significant soreness, particularly in her legs, but this has decreased over time. She has committed to a three-month membership at a [employer], attending once a week.  She experienced a recent stomach bug that resulted in a temporary eight-pound weight loss, most of which returned. Her current medication continues to suppress hunger, but she is now able to finish her meals, raising concerns about consuming more calories than intended. She prefers to minimize medication use  and is cautious about increasing her dosage.      Challenges affecting patient progress: strong hunger signals and/or impaired satiety / inhibitory control.    Pharmacotherapy for weight management: She is currently taking Zepbound  with adequate clinical response  and without side effects..   Assessment and Plan   Treatment Plan For Obesity:  Recommended Dietary Goals  Mia Oconnell is currently in the action stage of change. As such, her goal is to continue weight management plan. She has agreed to: continue current plan  Behavioral Health and Counseling  We discussed the following behavioral modification strategies today: continue to work on maintaining a reduced calorie state, getting the recommended amount of protein, incorporating whole foods, making healthy choices, staying well hydrated and practicing mindfulness when eating..  Additional education and resources provided today: None  Recommended Physical Activity Goals  Mia Oconnell has been advised to work up to 150 minutes of moderate intensity aerobic activity a week and strengthening exercises 2-3 times per week for cardiovascular health, weight loss maintenance and preservation of muscle mass.   She has agreed to :  continue to gradually increase the amount and intensity of exercise routine  Medical Interventions and Pharmacotherapy  We discussed various medication options to help Mia Oconnell with her weight loss efforts and we both agreed to : Adequate clinical response to anti-obesity medication, continue current regimen and consider increasing to 7.5 if weight loss plateau ensues  Associated Conditions Impacted by Obesity Treatment  Assessment & Plan Class 2 severe obesity with serious comorbidity and body mass index (  BMI) of 37.0 to 37.9 in adult, unspecified obesity type Mia Oconnell) She is undergoing medical weight management with a 27-pound weight loss since treatment initiation. Body fat percentage decreased from 47.9% to 44%,  and visceral fat rating improved from 14 to 12. Currently on Zepbound  5 mg, achieving a 12% weight loss. Reports medication still suppresses hunger but can now finish meals, indicating potential decrease in efficacy. Prefers to minimize medication use and is not experiencing significant side effects. Emphasized importance of gradual weight loss to maintain muscle mass and achieve long-term success. Outcome statistics indicate 10-15% weight loss on 5 mg, 20% on 10 mg, and 22% on 15 mg. Decision made to maintain current dose for another month to monitor progress, as weight loss has not plateaued. - Continue Zepbound  5 mg for another month - Monitor weight loss progress - Consider increasing Zepbound  to 7.5 mg if weight loss rate slows to 1-2 pounds per month Hypertension, unspecified type Blood pressure control has improved.  She is currently on amlodipine .  She has lost 27 pounds and is on Zepbound  so anticipate further reductions.  She may be able to come off the medication eventually.  Continue current regimen Abnormal metabolism Patient has a slower than predicted metabolism. IC 1685 vs. calculated 1807. This may contribute to weight gain, chronic fatigue and difficulty losing weight.   She has started to do physical activity and is enjoying Pilates.  Abnormal food appetite Improved on Zepbound  but is noticing increase in portions.  Current dose may be losing effectiveness.  She has increased orixegenic signaling, impaired satiety and inhibitory control. This is secondary to an abnormal energy regulation system and pathological neurohormonal pathways characteristic of excess adiposity.  Consider increasing medication at the next office visit     Exercise and Lifestyle Modification She is engaging in regular exercise, including personal training sessions and Pilates classes. Committed to a 12-week membership at a [employer] with weekly sessions. Reports improved exercise tolerance and focuses on  flexibility and strength training. Encouraged to continue exercise regimen with emphasis on consistency and gradual progress. - Continue weekly Pilates sessions for 12 weeks - Encourage regular exercise and consistency        Objective   Physical Exam:  Blood pressure 122/80, pulse 72, temperature 97.8 F (36.6 C), height 5' 7 (1.702 m), weight 211 lb (95.7 kg), SpO2 99%. Body mass index is 33.05 kg/m.  General: She is overweight, cooperative, alert, well developed, and in no acute distress. PSYCH: Has normal mood, affect and thought process.   HEENT: EOMI, sclerae are anicteric. Lungs: Normal breathing effort, no conversational dyspnea. Extremities: No edema.  Neurologic: No gross sensory or motor deficits. No tremors or fasciculations noted.    Diagnostic Data Reviewed:  BMET    Component Value Date/Time   NA 141 07/11/2023 0905   NA 140 08/17/2022 0839   K 4.5 07/11/2023 0905   CL 104 07/11/2023 0905   CO2 28 07/11/2023 0905   GLUCOSE 101 (H) 07/11/2023 0905   BUN 13 07/11/2023 0905   BUN 16 08/17/2022 0839   CREATININE 0.72 07/11/2023 0905   CALCIUM  10.0 07/11/2023 0905   Lab Results  Component Value Date   HGBA1C 5.9 07/11/2023   HGBA1C 6.0 10/18/2016   Lab Results  Component Value Date   INSULIN  6.4 08/17/2022   Lab Results  Component Value Date   TSH 1.74 07/11/2023   CBC    Component Value Date/Time   WBC 8.1 07/11/2023 0905   RBC  4.63 07/11/2023 0905   HGB 14.4 07/11/2023 0905   HGB 13.8 08/17/2022 0839   HCT 42.7 07/11/2023 0905   HCT 40.7 08/17/2022 0839   PLT 227.0 07/11/2023 0905   MCV 92.1 07/11/2023 0905   MCV 93 08/17/2022 0839   MCH 31.5 08/17/2022 0839   MCHC 33.9 07/11/2023 0905   RDW 13.4 07/11/2023 0905   RDW 12.3 08/17/2022 0839   Iron Studies No results found for: IRON, TIBC, FERRITIN, IRONPCTSAT Lipid Panel     Component Value Date/Time   CHOL 146 07/11/2023 0905   CHOL 161 08/17/2022 0839   TRIG 60.0  07/11/2023 0905   HDL 67.80 07/11/2023 0905   HDL 73 08/17/2022 0839   CHOLHDL 2 07/11/2023 0905   VLDL 12.0 07/11/2023 0905   LDLCALC 66 07/11/2023 0905   LDLCALC 74 08/17/2022 0839   Hepatic Function Panel     Component Value Date/Time   PROT 7.3 07/11/2023 0905   PROT 6.8 08/17/2022 0839   ALBUMIN 4.7 07/11/2023 0905   ALBUMIN 4.5 08/17/2022 0839   AST 18 07/11/2023 0905   ALT 17 07/11/2023 0905   ALKPHOS 63 07/11/2023 0905   BILITOT 0.7 07/11/2023 0905   BILITOT 0.6 08/17/2022 0839      Component Value Date/Time   TSH 1.74 07/11/2023 0905   Nutritional Lab Results  Component Value Date   VD25OH 40.58 07/11/2023   VD25OH 38.1 08/17/2022   VD25OH 54.44 04/24/2019    Medications: Outpatient Encounter Medications as of 10/02/2023  Medication Sig   amLODipine  (NORVASC ) 5 MG tablet Take 1 tablet (5 mg total) by mouth daily.   atorvastatin  (LIPITOR) 40 MG tablet TAKE 1 40 MG TABLET BY MOUTH EVERYDAY AT 6 PM   Cholecalciferol (VITAMIN D ) 125 MCG (5000 UT) CAPS    levocetirizine (XYZAL ALLERGY 24HR) 5 MG tablet    Multiple Vitamins-Minerals (CENTRUM SILVER 50+WOMEN) TABS    Omega-3 Fatty Acids (FISH OIL) 1000 MG CAPS    [DISCONTINUED] tirzepatide  (ZEPBOUND ) 5 MG/0.5ML Pen Inject 5 mg into the skin once a week.   tirzepatide  (ZEPBOUND ) 5 MG/0.5ML Pen Inject 5 mg into the skin once a week.   No facility-administered encounter medications on file as of 10/02/2023.     Follow-Up   Return in about 4 weeks (around 10/30/2023) for For Weight Mangement with Dr. Francyne.SABRA She was informed of the importance of frequent follow up visits to maximize her success with intensive lifestyle modifications for her multiple health conditions.  Attestation Statement   Reviewed by clinician on day of visit: allergies, medications, problem list, medical history, surgical history, family history, social history, and previous encounter notes.     Lucas Francyne, MD

## 2023-10-02 NOTE — Assessment & Plan Note (Signed)
 Blood pressure control has improved.  She is currently on amlodipine .  She has lost 27 pounds and is on Zepbound  so anticipate further reductions.  She may be able to come off the medication eventually.  Continue current regimen

## 2023-11-06 ENCOUNTER — Encounter (INDEPENDENT_AMBULATORY_CARE_PROVIDER_SITE_OTHER): Payer: Self-pay | Admitting: Internal Medicine

## 2023-11-06 ENCOUNTER — Ambulatory Visit (INDEPENDENT_AMBULATORY_CARE_PROVIDER_SITE_OTHER): Admitting: Internal Medicine

## 2023-11-06 VITALS — BP 136/84 | HR 80 | Ht 67.0 in | Wt 213.0 lb

## 2023-11-06 DIAGNOSIS — E66812 Obesity, class 2: Secondary | ICD-10-CM | POA: Diagnosis not present

## 2023-11-06 DIAGNOSIS — R638 Other symptoms and signs concerning food and fluid intake: Secondary | ICD-10-CM | POA: Diagnosis not present

## 2023-11-06 DIAGNOSIS — R948 Abnormal results of function studies of other organs and systems: Secondary | ICD-10-CM | POA: Diagnosis not present

## 2023-11-06 DIAGNOSIS — Z6837 Body mass index (BMI) 37.0-37.9, adult: Secondary | ICD-10-CM

## 2023-11-06 DIAGNOSIS — I10 Essential (primary) hypertension: Secondary | ICD-10-CM

## 2023-11-06 MED ORDER — ZEPBOUND 7.5 MG/0.5ML ~~LOC~~ SOAJ: 7.5000 mg | SUBCUTANEOUS | 0 refills | Status: DC

## 2023-11-06 NOTE — Progress Notes (Unsigned)
 Office: [phone]  /  Fax: [phone]  Weight Summary and Body Composition Analysis (BIA)  Vitals BP: 136/84 Pulse Rate: 80 SpO2: 100 %   Anthropometric Measurements Height: 5' 7 (1.702 m) Weight: 213 lb (96.6 kg) BMI (Calculated): 33.35 Weight at Last Visit: 211 lb Weight Lost Since Last Visit: 0 lb Weight Gained Since Last Visit: 2 lb Starting Weight: 238 lb Total Weight Loss (lbs): 25 lb (11.3 kg) Peak Weight: 247 lb   Body Composition  Body Fat %: 46 % Fat Mass (lbs): 98.4 lbs Muscle Mass (lbs): 109.6 lbs Total Body Water (lbs): 90 lbs Visceral Fat Rating : 12    RMR: 1685  Today's Visit #: 12  Starting Date: 08/17/22   Subjective   Chief Complaint: Obesity  Interval History Discussed the use of AI scribe software for clinical note transcription with the patient, who gave verbal consent to proceed.  History of Present Illness   Mia Oconnell is a 58 year old female who presents for medical weight management.  She has gained two pounds since her last visit. She adheres to a 1200 calorie nutrition plan approximately 80% of the time but feels unmotivated and has noticed a slowdown in her weight loss progress. Despite this, her current medication effectively controls her appetite.  Her eating habits include sometimes consuming more during meals than before. Her favorite meals include chicken skewers or a big salad with chicken from a [place]. She sometimes feels the urge to continue eating after finishing a meal and occasionally indulges in ice cream, trading it for other foods to maintain her calorie intake. She acknowledges some cravings but makes conscious choices about her food intake. She does not currently struggle with emotional eating or stress-related eating, although she recognizes that having more free time could increase her risk of snacking. She tries to control her environment by not purchasing enticing snacks.  She reports having  lost 32 pounds since starting her weight management journey. She engages in physical activities such as Pilates and morning walks. She wants to increase her Pilates sessions and is considering discontinuing her personal training sessions. She feels that her clothes fit better and acknowledges that she is in a better place than before, but she still aims to lose more weight.  Socially, she is preparing for her husband to move to [place] for work and is waiting for her yard to be fenced so her dog can return home.       Challenges affecting patient progress: some demotivation. SABRA    Pharmacotherapy for weight management: She is currently taking Zepbound  without side effects. and waning effect.   Assessment and Plan   Treatment Plan For Obesity:  Recommended Dietary Goals  Mia Oconnell is currently in the action stage of change. As such, her goal is to continue weight management plan. She has agreed to: continue current plan  Behavioral Health and Counseling  We discussed the following behavioral modification strategies today: continue to work on maintaining a reduced calorie state, getting the recommended amount of protein, incorporating whole foods, making healthy choices, staying well hydrated and practicing mindfulness when eating..  Additional education and resources provided today: None  Recommended Physical Activity Goals  Mia Oconnell has been advised to work up to 150 minutes of moderate intensity aerobic activity a week and strengthening exercises 2-3 times per week for cardiovascular health, weight loss maintenance and preservation of muscle mass.   She has agreed to :  Think about enjoyable ways to increase daily  physical activity and overcoming barriers to exercise and Increase physical activity in their day and reduce sedentary time (increase NEAT).  Medical Interventions and Pharmacotherapy  We discussed various medication options to help Mia Oconnell with her weight loss efforts and  we both agreed to : Increase Zepbound  to 7.5 mg once a week  Associated Conditions Impacted by Obesity Treatment  Assessment & Plan Class 2 severe obesity with serious comorbidity and body mass index (BMI) of 37.0 to 37.9 in adult, unspecified obesity type (HCC) Weight: decrease of 32.4 lb (13.2%) over 9 months  Start: 02/06/2023 245 lb 6.4 oz (111.3 kg)  End: 11/06/2023 213 lb (96.6 kg)   Continue current reduced calorie nutrition plan Increase Zepbound  to 7.5 mg once a week Continue to work on increasing volume of physical activity Abnormal metabolism Patient has a slower than predicted metabolism. IC 1685 vs. calculated 1807. This may contribute to weight gain, chronic fatigue and difficulty losing weight.   She has started to do physical activity and is enjoying Pilates.  Continue to increase volume of physical activity and maintain adequate protein intake recommended 30 to 40 g per meal 90 to 120 g/day  Abnormal food appetite Improved on Zepbound  but is noticing increase in portions.  Current dose may be losing effectiveness.  She has increased orixegenic signaling, impaired satiety and inhibitory control. This is secondary to an abnormal energy regulation system and pathological neurohormonal pathways characteristic of excess adiposity.  Increase Zepbound  to 7.5 mg once a week  Hypertension, unspecified type Blood pressure today slightly above target.  She is currently on amlodipine .  We are increasing Zepbound  which will help lower blood pressure.  Continue monitoring at home for goal of less than 130/80      Objective   Physical Exam:  Blood pressure 136/84, pulse 80, height 5' 7 (1.702 m), weight 213 lb (96.6 kg), SpO2 100%. Body mass index is 33.36 kg/m.  General: She is overweight, cooperative, alert, well developed, and in no acute distress. PSYCH: Has normal mood, affect and thought process.   HEENT: EOMI, sclerae are anicteric. Lungs: Normal breathing effort, no  conversational dyspnea. Extremities: No edema.  Neurologic: No gross sensory or motor deficits. No tremors or fasciculations noted.    Diagnostic Data Reviewed:  BMET    Component Value Date/Time   NA 141 07/11/2023 0905   NA 140 08/17/2022 0839   K 4.5 07/11/2023 0905   CL 104 07/11/2023 0905   CO2 28 07/11/2023 0905   GLUCOSE 101 (H) 07/11/2023 0905   BUN 13 07/11/2023 0905   BUN 16 08/17/2022 0839   CREATININE 0.72 07/11/2023 0905   CALCIUM  10.0 07/11/2023 0905   Lab Results  Component Value Date   HGBA1C 5.9 07/11/2023   HGBA1C 6.0 10/18/2016   Lab Results  Component Value Date   INSULIN  6.4 08/17/2022   Lab Results  Component Value Date   TSH 1.74 07/11/2023   CBC    Component Value Date/Time   WBC 8.1 07/11/2023 0905   RBC 4.63 07/11/2023 0905   HGB 14.4 07/11/2023 0905   HGB 13.8 08/17/2022 0839   HCT 42.7 07/11/2023 0905   HCT 40.7 08/17/2022 0839   PLT 227.0 07/11/2023 0905   MCV 92.1 07/11/2023 0905   MCV 93 08/17/2022 0839   MCH 31.5 08/17/2022 0839   MCHC 33.9 07/11/2023 0905   RDW 13.4 07/11/2023 0905   RDW 12.3 08/17/2022 0839   Iron Studies No results found for: IRON, TIBC, FERRITIN, IRONPCTSAT  Lipid Panel     Component Value Date/Time   CHOL 146 07/11/2023 0905   CHOL 161 08/17/2022 0839   TRIG 60.0 07/11/2023 0905   HDL 67.80 07/11/2023 0905   HDL 73 08/17/2022 0839   CHOLHDL 2 07/11/2023 0905   VLDL 12.0 07/11/2023 0905   LDLCALC 66 07/11/2023 0905   LDLCALC 74 08/17/2022 0839   Hepatic Function Panel     Component Value Date/Time   PROT 7.3 07/11/2023 0905   PROT 6.8 08/17/2022 0839   ALBUMIN 4.7 07/11/2023 0905   ALBUMIN 4.5 08/17/2022 0839   AST 18 07/11/2023 0905   ALT 17 07/11/2023 0905   ALKPHOS 63 07/11/2023 0905   BILITOT 0.7 07/11/2023 0905   BILITOT 0.6 08/17/2022 0839      Component Value Date/Time   TSH 1.74 07/11/2023 0905   Nutritional Lab Results  Component Value Date   VD25OH 40.58  07/11/2023   VD25OH 38.1 08/17/2022   VD25OH 54.44 04/24/2019    Medications: Outpatient Encounter Medications as of 11/06/2023  Medication Sig   amLODipine  (NORVASC ) 5 MG tablet Take 1 tablet (5 mg total) by mouth daily.   atorvastatin  (LIPITOR) 40 MG tablet TAKE 1 40 MG TABLET BY MOUTH EVERYDAY AT 6 PM   Cholecalciferol (VITAMIN D ) 125 MCG (5000 UT) CAPS    levocetirizine (XYZAL ALLERGY 24HR) 5 MG tablet    Multiple Vitamins-Minerals (CENTRUM SILVER 50+WOMEN) TABS    Omega-3 Fatty Acids (FISH OIL) 1000 MG CAPS    tirzepatide  (ZEPBOUND ) 7.5 MG/0.5ML Pen Inject 7.5 mg into the skin once a week.   [DISCONTINUED] tirzepatide  (ZEPBOUND ) 5 MG/0.5ML Pen Inject 5 mg into the skin once a week.   No facility-administered encounter medications on file as of 11/06/2023.     Follow-Up   No follow-ups on file.SABRA She was informed of the importance of frequent follow up visits to maximize her success with intensive lifestyle modifications for her multiple health conditions.  Attestation Statement   Reviewed by clinician on day of visit: allergies, medications, problem list, medical history, surgical history, family history, social history, and previous encounter notes.     Lucas Parker, MD

## 2023-11-07 NOTE — Assessment & Plan Note (Signed)
 Improved on Zepbound  but is noticing increase in portions.  Current dose may be losing effectiveness.  She has increased orixegenic signaling, impaired satiety and inhibitory control. This is secondary to an abnormal energy regulation system and pathological neurohormonal pathways characteristic of excess adiposity.  Increase Zepbound  to 7.5 mg once a week

## 2023-11-07 NOTE — Assessment & Plan Note (Signed)
 Blood pressure today slightly above target.  She is currently on amlodipine .  We are increasing Zepbound  which will help lower blood pressure.  Continue monitoring at home for goal of less than 130/80

## 2023-11-07 NOTE — Assessment & Plan Note (Signed)
 Weight: decrease of 32.4 lb (13.2%) over 9 months  Start: 02/06/2023 245 lb 6.4 oz (111.3 kg)  End: 11/06/2023 213 lb (96.6 kg)   Continue current reduced calorie nutrition plan Increase Zepbound  to 7.5 mg once a week Continue to work on increasing volume of physical activity

## 2023-11-07 NOTE — Assessment & Plan Note (Signed)
 Patient has a slower than predicted metabolism. IC 1685 vs. calculated 1807. This may contribute to weight gain, chronic fatigue and difficulty losing weight.   She has started to do physical activity and is enjoying Pilates.  Continue to increase volume of physical activity and maintain adequate protein intake recommended 30 to 40 g per meal 90 to 120 g/day

## 2023-11-08 ENCOUNTER — Ambulatory Visit (INDEPENDENT_AMBULATORY_CARE_PROVIDER_SITE_OTHER): Admitting: Podiatry

## 2023-11-08 DIAGNOSIS — M19072 Primary osteoarthritis, left ankle and foot: Secondary | ICD-10-CM

## 2023-11-08 DIAGNOSIS — M722 Plantar fascial fibromatosis: Secondary | ICD-10-CM | POA: Diagnosis not present

## 2023-11-08 NOTE — Progress Notes (Signed)
 She presents today with continued foot pain left.  She states that is not hurting as bad as it has in the past that she is referring to her plantar fasciitis and subtalar joint capsulitis.  She states that she has ordered some new shoes that she really enjoys wearing they are an orthopedic type tennis shoe.  Objective: Vital signs are stable alert oriented x 3 pulses are palpable.  She has some tenderness on palpation medial calcaneal tubercle and on palpation of the sinus tarsi with tenderness on end range of motion of that left subtalar joint.  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  Plan: If this persists we have decided MRI would be our next step for evaluation considering all conservative therapies have failed to render her asymptomatic.  We did discuss orthotics we did discuss appropriate shoe gear and not wearing sandals.  She understands this is amendable to it we will follow-up with her for MRI as needed.

## 2023-11-10 ENCOUNTER — Encounter: Payer: Self-pay | Admitting: Family

## 2023-11-13 ENCOUNTER — Ambulatory Visit: Admitting: Podiatry

## 2023-11-27 ENCOUNTER — Ambulatory Visit: Admitting: Family

## 2023-12-04 ENCOUNTER — Ambulatory Visit (INDEPENDENT_AMBULATORY_CARE_PROVIDER_SITE_OTHER): Admitting: Internal Medicine

## 2023-12-07 ENCOUNTER — Encounter (INDEPENDENT_AMBULATORY_CARE_PROVIDER_SITE_OTHER): Payer: Self-pay | Admitting: Internal Medicine

## 2023-12-11 ENCOUNTER — Other Ambulatory Visit (INDEPENDENT_AMBULATORY_CARE_PROVIDER_SITE_OTHER): Payer: Self-pay

## 2023-12-11 ENCOUNTER — Encounter (INDEPENDENT_AMBULATORY_CARE_PROVIDER_SITE_OTHER): Payer: Self-pay

## 2023-12-11 MED ORDER — TIRZEPATIDE-WEIGHT MANAGEMENT 5 MG/0.5ML ~~LOC~~ SOLN
5.0000 mg | SUBCUTANEOUS | 0 refills | Status: DC
Start: 2023-12-11 — End: 2023-12-12

## 2023-12-12 ENCOUNTER — Other Ambulatory Visit (INDEPENDENT_AMBULATORY_CARE_PROVIDER_SITE_OTHER): Payer: Self-pay

## 2023-12-12 MED ORDER — TIRZEPATIDE-WEIGHT MANAGEMENT 5 MG/0.5ML ~~LOC~~ SOAJ: 5.0000 mg | SUBCUTANEOUS | 0 refills | Status: DC

## 2023-12-13 ENCOUNTER — Ambulatory Visit (INDEPENDENT_AMBULATORY_CARE_PROVIDER_SITE_OTHER): Admitting: Internal Medicine

## 2023-12-13 ENCOUNTER — Other Ambulatory Visit (INDEPENDENT_AMBULATORY_CARE_PROVIDER_SITE_OTHER): Payer: Self-pay

## 2023-12-13 MED ORDER — TIRZEPATIDE-WEIGHT MANAGEMENT 5 MG/0.5ML ~~LOC~~ SOAJ: 5.0000 mg | SUBCUTANEOUS | 0 refills | Status: DC

## 2023-12-14 ENCOUNTER — Ambulatory Visit: Admitting: Family

## 2023-12-15 ENCOUNTER — Ambulatory Visit: Admitting: Family

## 2023-12-18 ENCOUNTER — Ambulatory Visit (INDEPENDENT_AMBULATORY_CARE_PROVIDER_SITE_OTHER): Admitting: Internal Medicine

## 2023-12-18 VITALS — BP 138/86 | HR 62 | Temp 98.3°F | Ht 67.0 in | Wt 206.0 lb

## 2023-12-18 DIAGNOSIS — Z6837 Body mass index (BMI) 37.0-37.9, adult: Secondary | ICD-10-CM

## 2023-12-18 DIAGNOSIS — R638 Other symptoms and signs concerning food and fluid intake: Secondary | ICD-10-CM

## 2023-12-18 DIAGNOSIS — E66812 Obesity, class 2: Secondary | ICD-10-CM | POA: Diagnosis not present

## 2023-12-18 DIAGNOSIS — R948 Abnormal results of function studies of other organs and systems: Secondary | ICD-10-CM | POA: Diagnosis not present

## 2023-12-18 DIAGNOSIS — I10 Essential (primary) hypertension: Secondary | ICD-10-CM

## 2023-12-18 MED ORDER — TIRZEPATIDE-WEIGHT MANAGEMENT 5 MG/0.5ML ~~LOC~~ SOAJ: 5.0000 mg | SUBCUTANEOUS | 0 refills | Status: DC

## 2023-12-18 NOTE — Progress Notes (Unsigned)
 Office: [phone]  /  Fax: [phone]  Weight Summary and Body Composition Analysis (BIA)  Vitals Temp: 98.3 F (36.8 C) BP: 138/86 Pulse Rate: 62 SpO2: 100 %   Anthropometric Measurements Height: 5' 7 (1.702 m) Weight: 206 lb (93.4 kg) BMI (Calculated): 32.26 Weight at Last Visit: 213 lb Weight Lost Since Last Visit: 7 lb Weight Gained Since Last Visit: 0 lb Starting Weight: 238 lb Total Weight Loss (lbs): 32 lb (14.5 kg) Peak Weight: 247 lb   Body Composition  Body Fat %: 43.5 % Fat Mass (lbs): 89.6 lbs Muscle Mass (lbs): 110.8 lbs Total Body Water (lbs): 83.4 lbs Visceral Fat Rating : 11    RMR: 1685  Today's Visit #: 13  Starting Date: 08/17/22   Subjective   Chief Complaint: Obesity  Interval History Discussed the use of AI scribe software for clinical note transcription with the patient, who gave verbal consent to proceed.  History of Present Illness Mia Oconnell is a 58 year old female who presents for medical weight management.  Since last office visit she has lost 7 pounds.  She is following a 1200-calorie nutrition plan 85% of the time.  She reports eating more whole foods getting the recommended amount of protein.  She is exercising 5 to 6 days a week 30 to 50 minutes  She has lost seven pounds since her last visit, attributing this to increased physical activity, particularly playing with her dog in the backyard. Her visceral fat rating has dropped from fourteen to eleven.  She is currently on a medication regimen of five milligrams, which she has maintained without switching to seven and a half milligrams, as she feels her weight loss has resumed with the current dose. She has a full box of seven and a half milligrams available if needed and reports adequate appetite control with the current dose.  Her blood pressure readings have been variable, with a recent reading of 143/79. She takes amlodipine  and monitors her blood pressure at  home. She has a history of high blood pressure.  She is also on Lipitor for cholesterol management. Her A1c has improved from 6.6 in 2020 to 5.9.  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 Her husband is preparing to move to [place] for work, which may impact her travel and caregiving responsibilities.  No regular skipping of meals, but sometimes skips breakfast on weekends. No coffee intake on the day of the visit.     Challenges affecting patient progress: none.    Pharmacotherapy for weight management: She is currently taking Zepbound  with adequate clinical response  and without side effects..   Assessment and Plan   Treatment Plan For Obesity:  Recommended Dietary Goals  Mia Oconnell is currently in the action stage of change. As such, her goal is to continue weight management plan. She has agreed to: continue current plan  Behavioral Health and Counseling  We discussed the following behavioral modification strategies today: continue to work on maintaining a reduced calorie state, getting the recommended amount of protein, incorporating whole foods, making healthy choices, staying well hydrated and practicing mindfulness when eating. and increase protein intake, fibrous foods (25 grams per day for women, 30 grams for men) and water to improve satiety and decrease hunger signals. .  Additional education and resources provided today: None  Recommended Physical Activity Goals  Mia Oconnell has been advised to work up to 150 minutes of moderate intensity aerobic activity a  week and strengthening exercises 2-3 times per week for cardiovascular health, weight loss maintenance and preservation of muscle mass.  She has agreed to :  Continue to gradually increase the amount and intensity of exercise routine and Combine aerobic and strengthening exercises for efficiency and improved cardiometabolic health.  Medical Interventions and  Pharmacotherapy  We discussed various medication options to help Mia Oconnell with her weight loss efforts and we both agreed to : Adequate clinical response to anti-obesity medication, continue current regimen  Associated Conditions Impacted by Obesity Treatment  Assessment & Plan Class 2 severe obesity with serious comorbidity and body mass index (BMI) of 37.0 to 37.9 in adult, unspecified obesity type  Hypertension, unspecified type  Abnormal metabolism  Abnormal food appetite Stable.  Now on Zepbound  5 mg once a week.  We again discussed the role of protein and fiber in satiation.  Continue medication     Assessment and Plan Assessment & Plan Obesity Mia Oconnell has lost 39 pounds, equating to 16% of her body weight, over ten months and a week. Her visceral fat rating decreased from 14 to 11, with a goal of less than 10. Her body fat percentage decreased from 47.9% to 43%, with a target of 34%. She attributes her weight loss to increased physical activity, particularly playing with her dog, which has increased her step count. She has not increased her Zepbound  dosage from 5 mg to 7.5 mg, as she feels the current dosage is effective with her increased exercise. There are no signs of a plateau, and her body composition changes are favorable. - Continue Zepbound  5 mg - Encourage continued physical activity  Essential hypertension Blood pressure readings today were 138/86 and 143/79, above the target of less than 130/80. She is currently taking amlodipine . The importance of checking blood pressure in the morning before coffee and in the evening before taking medication was discussed to assess the medication's effectiveness throughout the day. Optimal blood pressure is 120/80 to prevent complications such as strokes and heart failure. - Monitor blood pressure at home in the morning and evening - Continue amlodipine  - Adjust blood pressure regimen if home readings indicate inadequate  control  Hyperlipidemia Currently taking Lipitor for cholesterol management. No recent blood work was discussed, but she had blood work done in April. There are no immediate concerns regarding her lipid levels. - Continue Lipitor - Consider blood work in the spring  Type 2 diabetes mellitus in remission Previously had an A1c of 6.6% in 2020, indicating diabetes, but it is now down to 5.9%, indicating remission. Her A1c is expected to continue improving as her body composition improves. The option to check A1c at the next visit was discussed, but it was not deemed urgent. - Consider checking A1c at next visit  Recording duration: 20 minutes  Weight: decrease of 39.4 lb (16.1%) over 10 months, 1 week  Start: 02/06/2023 245 lb 6.4 oz (111.3 kg)  End: 12/18/2023 206 lb (93.4 kg)      Objective   Physical Exam:  Blood pressure 138/86, pulse 62, temperature 98.3 F (36.8 C), height 5' 7 (1.702 m), weight 206 lb (93.4 kg), SpO2 100%. Body mass index is 32.26 kg/m.  General: She is overweight, cooperative, alert, well developed, and in no acute distress. PSYCH: Has normal mood, affect and thought process.   HEENT: EOMI, sclerae are anicteric. Lungs: Normal breathing effort, no conversational dyspnea. Extremities: No edema.  Neurologic: No gross sensory or motor deficits. No tremors or fasciculations noted.  Diagnostic Data Reviewed:  BMET    Component Value Date/Time   NA 141 07/11/2023 0905   NA 140 08/17/2022 0839   K 4.5 07/11/2023 0905   CL 104 07/11/2023 0905   CO2 28 07/11/2023 0905   GLUCOSE 101 (H) 07/11/2023 0905   BUN 13 07/11/2023 0905   BUN 16 08/17/2022 0839   CREATININE 0.72 07/11/2023 0905   CALCIUM  10.0 07/11/2023 0905   Lab Results  Component Value Date   HGBA1C 5.9 07/11/2023   HGBA1C 6.0 10/18/2016   Lab Results  Component Value Date   INSULIN  6.4 08/17/2022   Lab Results  Component Value Date   TSH 1.74 07/11/2023   CBC    Component  Value Date/Time   WBC 8.1 07/11/2023 0905   RBC 4.63 07/11/2023 0905   HGB 14.4 07/11/2023 0905   HGB 13.8 08/17/2022 0839   HCT 42.7 07/11/2023 0905   HCT 40.7 08/17/2022 0839   PLT 227.0 07/11/2023 0905   MCV 92.1 07/11/2023 0905   MCV 93 08/17/2022 0839   MCH 31.5 08/17/2022 0839   MCHC 33.9 07/11/2023 0905   RDW 13.4 07/11/2023 0905   RDW 12.3 08/17/2022 0839   Iron Studies No results found for: IRON, TIBC, FERRITIN, IRONPCTSAT Lipid Panel     Component Value Date/Time   CHOL 146 07/11/2023 0905   CHOL 161 08/17/2022 0839   TRIG 60.0 07/11/2023 0905   HDL 67.80 07/11/2023 0905   HDL 73 08/17/2022 0839   CHOLHDL 2 07/11/2023 0905   VLDL 12.0 07/11/2023 0905   LDLCALC 66 07/11/2023 0905   LDLCALC 74 08/17/2022 0839   Hepatic Function Panel     Component Value Date/Time   PROT 7.3 07/11/2023 0905   PROT 6.8 08/17/2022 0839   ALBUMIN 4.7 07/11/2023 0905   ALBUMIN 4.5 08/17/2022 0839   AST 18 07/11/2023 0905   ALT 17 07/11/2023 0905   ALKPHOS 63 07/11/2023 0905   BILITOT 0.7 07/11/2023 0905   BILITOT 0.6 08/17/2022 0839      Component Value Date/Time   TSH 1.74 07/11/2023 0905   Nutritional Lab Results  Component Value Date   VD25OH 40.58 07/11/2023   VD25OH 38.1 08/17/2022   VD25OH 54.44 04/24/2019    Medications: Outpatient Encounter Medications as of 12/18/2023  Medication Sig   amLODipine  (NORVASC ) 5 MG tablet Take 1 tablet (5 mg total) by mouth daily.   atorvastatin  (LIPITOR) 40 MG tablet TAKE 1 40 MG TABLET BY MOUTH EVERYDAY AT 6 PM   Cholecalciferol (VITAMIN D ) 125 MCG (5000 UT) CAPS    levocetirizine (XYZAL ALLERGY 24HR) 5 MG tablet    Multiple Vitamins-Minerals (CENTRUM SILVER 50+WOMEN) TABS    Omega-3 Fatty Acids (FISH OIL) 1000 MG CAPS    [DISCONTINUED] tirzepatide  (ZEPBOUND ) 5 MG/0.5ML Pen Inject 5 mg into the skin once a week.   [START ON 01/12/2024] tirzepatide  (ZEPBOUND ) 5 MG/0.5ML Pen Inject 5 mg into the skin once a week.   No  facility-administered encounter medications on file as of 12/18/2023.     Follow-Up   Return in about 6 weeks (around 01/29/2024) for For Weight Mangement with Dr. Francyne.SABRA She was informed of the importance of frequent follow up visits to maximize her success with intensive lifestyle modifications for her multiple health conditions.  Attestation Statement   Reviewed by clinician on day of visit: allergies, medications, problem list, medical history, surgical history, family history, social history, and previous encounter notes.     Lucas Francyne, MD

## 2023-12-18 NOTE — Assessment & Plan Note (Signed)
 Stable.  Now on Zepbound  5 mg once a week.  We again discussed the role of protein and fiber in satiation.  Continue medication

## 2023-12-19 NOTE — Assessment & Plan Note (Signed)
 Patient has a slower than predicted metabolism. IC 1685 vs. calculated 1807. This may contribute to weight gain, chronic fatigue and difficulty losing weight.   She has started to do physical activity and is enjoying Pilates.  Continue to increase volume of physical activity

## 2023-12-19 NOTE — Assessment & Plan Note (Signed)
 Mia Oconnell has lost 39 pounds, equating to 16% of her body weight, over ten months and a week. Her visceral fat rating decreased from 14 to 11, with a goal of less than 10. Her body fat percentage decreased from 47.9% to 43%, with a target of 34%. She attributes her weight loss to increased physical activity, particularly playing with her dog, which has increased her step count. She has not increased her Zepbound  dosage from 5 mg to 7.5 mg, as she feels the current dosage is effective with her increased exercise. There are no signs of a plateau, and her body composition changes are favorable. -Continue reduced calorie nutrition plan - Continue Zepbound  5 mg - Encourage continued physical activity, work on increasing volume to 240 minutes a week

## 2023-12-19 NOTE — Assessment & Plan Note (Signed)
 Blood pressure readings today were 138/86 and 143/79, above the target of less than 130/80. She is currently taking amlodipine . The importance of checking blood pressure in the morning before coffee and in the evening before taking medication was discussed to assess the medication's effectiveness throughout the day. Optimal blood pressure is 120/80 to prevent complications such as strokes and heart failure. - Monitor blood pressure at home in the morning and evening - Continue amlodipine  - Adjust blood pressure regimen if home readings indicate inadequate control

## 2024-01-23 NOTE — Progress Notes (Unsigned)
 PCP: Dineen Rollene MATSU, FNP   No chief complaint on file.   HPI:      Ms. Mia Oconnell is a 58 y.o. G0P0000 who LMP was No LMP recorded. (Menstrual status: Irregular Periods)., presents today for her annual examination.  Her menses are infrequent now due to perimenopause, every few months, lasting 5-6 days, mod to heavy flow, no clots, no BTB, no dysmen. No longer on POPs. Does have vasomotor sx.   Sex activity: single partner, contraception - none. She does not have vaginal dryness/pain/bleeding.  Last Pap: 09/06/22  Results were: no abnormalities /neg HPV DNA.  Hx of STDs: none  Last mammogram: 08/15/23 at [facility], Results: no abnormalities, repeat in 1 yr.  There is no FH of breast cancer. There is no FH of ovarian cancer. The patient do self-breast exams.  Colonoscopy: 2024 at [facility] without polyps; repeat due after 10 yrs;  6/19 with polyp; repeat due after 5 yrs.   Tobacco use: The patient denies current or previous tobacco use. Alcohol use: social Drug use: none Exercise: moderately active  She does get adequate calcium  and Vitamin D  in her diet.  Labs/med problems managed by PCP now.  Doing wt loss at [facility] wt loss clinic.   Past Medical History:  Diagnosis Date   Allergy    Anxiety    Constipation    History of basal cell cancer    on chest; follows with Dr Karoline   Hx of varicose vein stripping    Hyperlipidemia    Hypertension    Joint pain    Pre-diabetes    Vitamin D  deficiency     Past Surgical History:  Procedure Laterality Date   BREAST SURGERY     COLONOSCOPY WITH PROPOFOL  N/A 08/28/2017   Procedure: COLONOSCOPY WITH PROPOFOL ;  Surgeon: Unk Corinn Skiff, MD;  Location: [facility] ENDOSCOPY;  Service: Gastroenterology;  Laterality: N/A;   COLONOSCOPY WITH PROPOFOL  N/A 09/05/2022   Procedure: COLONOSCOPY WITH PROPOFOL ;  Surgeon: Unk Corinn Skiff, MD;  Location: [facility] ENDOSCOPY;  Service: Gastroenterology;  Laterality: N/A;   fracture arm  Left    Left radial fracture- 09/2021 , [facility]   FRACTURE SURGERY     SCLERAL BUCKLE     SKIN CANCER EXCISION N/A    05/2022, chest, Dr Chrystie; squamous cell carcinoma in situ   VARICOSE VEIN SURGERY     WRIST SURGERY     10/06/2021    Family History  Problem Relation Age of Onset   Stroke Mother    Hyperlipidemia Mother    Hypertension Mother    Basal cell carcinoma Mother 24   Alzheimer's disease Mother    Anxiety disorder Mother    Obesity Mother    Obesity Father    Hyperlipidemia Father    Hypertension Father    Hyperlipidemia Maternal Grandmother    Hypertension Maternal Grandmother    Hyperlipidemia Maternal Grandfather    Heart disease Maternal Grandfather    Hypertension Maternal Grandfather    Hyperlipidemia Paternal Grandmother    Hypertension Paternal Grandmother    Cancer Paternal Grandfather        colon   Hyperlipidemia Paternal Grandfather    Hypertension Paternal Grandfather    Alcohol abuse Paternal Uncle     Social History   Socioeconomic History   Marital status: Married    Spouse name: Not on file   Number of children: Not on file   Years of education: Not on file   Highest education level: Master's  degree (e.g., MA, MS, MEng, MEd, MSW, MBA)  Occupational History   Not on file  Tobacco Use   Smoking status: Never   Smokeless tobacco: Never  Vaping Use   Vaping status: Never Used  Substance and Sexual Activity   Alcohol use: No   Drug use: No   Sexual activity: Yes    Birth control/protection: None  Other Topics Concern   Not on file  Social History Narrative   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      Social Drivers of Health   Financial Resource Strain: Low Risk  (07/15/2022)   Overall Financial Resource Strain (CARDIA)    Difficulty of Paying Living Expenses: Not hard at all  Food Insecurity: No Food Insecurity (07/15/2022)   Hunger Vital Sign    Worried About Running Out of Food in the Last Year: Never true    Ran  Out of Food in the Last Year: Never true  Transportation Needs: No Transportation Needs (07/15/2022)   PRAPARE - Administrator, Civil Service (Medical): No    Lack of Transportation (Non-Medical): No  Physical Activity: Sufficiently Active (07/15/2022)   Exercise Vital Sign    Days of Exercise per Week: 5 days    Minutes of Exercise per Session: 30 min  Stress: No Stress Concern Present (07/15/2022)   [employer] of Occupational Health - Occupational Stress Questionnaire    Feeling of Stress : Only a little  Social Connections: Socially Integrated (07/15/2022)   Social Connection and Isolation Panel    Frequency of Communication with Friends and Family: More than three times a week    Frequency of Social Gatherings with Friends and Family: Three times a week    Attends Religious Services: More than 4 times per year    Active Member of Clubs or Organizations: Yes    Attends Banker Meetings: More than 4 times per year    Marital Status: Married  Catering Manager Violence: Not on file    Outpatient Medications Prior to Visit  Medication Sig Dispense Refill   amLODipine  (NORVASC ) 5 MG tablet Take 1 tablet (5 mg total) by mouth daily. 90 tablet 3   atorvastatin  (LIPITOR) 40 MG tablet TAKE 1 40 MG TABLET BY MOUTH EVERYDAY AT 6 PM 90 tablet 3   Cholecalciferol (VITAMIN D ) 125 MCG (5000 UT) CAPS      levocetirizine (XYZAL ALLERGY 24HR) 5 MG tablet      Multiple Vitamins-Minerals (CENTRUM SILVER 50+WOMEN) TABS      Omega-3 Fatty Acids (FISH OIL) 1000 MG CAPS      tirzepatide  (ZEPBOUND ) 5 MG/0.5ML Pen Inject 5 mg into the skin once a week. 2 mL 0   No facility-administered medications prior to visit.       ROS:  Review of Systems  Constitutional:  Negative for fatigue, fever and unexpected weight change.  Respiratory:  Negative for cough, shortness of breath and wheezing.   Cardiovascular:  Negative for chest pain, palpitations and leg swelling.   Gastrointestinal:  Negative for blood in stool, constipation, diarrhea, nausea and vomiting.  Endocrine: Negative for cold intolerance, heat intolerance and polyuria.  Genitourinary:  Negative for dyspareunia, dysuria, flank pain, frequency, genital sores, hematuria, menstrual problem, pelvic pain, urgency, vaginal bleeding, vaginal discharge and vaginal pain.  Musculoskeletal:  Negative for back pain, joint swelling and myalgias.  Skin:  Negative for rash.  Neurological:  Negative for dizziness, syncope, light-headedness, numbness and headaches.  Hematological:  Negative for  adenopathy.  Psychiatric/Behavioral:  Negative for agitation, confusion, sleep disturbance and suicidal ideas. The patient is not nervous/anxious.    BREAST: No symptoms    Objective: There were no vitals taken for this visit.   Physical Exam Constitutional:      Appearance: She is well-developed.  Genitourinary:     Vulva normal.     Right Labia: No rash, tenderness or lesions.    Left Labia: No tenderness, lesions or rash.    No vaginal discharge, erythema or tenderness.      Right Adnexa: not tender and no mass present.    Left Adnexa: not tender and no mass present.    No cervical friability or polyp.     Uterus is not enlarged or tender.  Breasts:    Right: No mass, nipple discharge, skin change or tenderness.     Left: No mass, nipple discharge, skin change or tenderness.  Neck:     Thyroid : No thyromegaly.  Cardiovascular:     Rate and Rhythm: Normal rate and regular rhythm.     Heart sounds: Normal heart sounds. No murmur heard. Pulmonary:     Effort: Pulmonary effort is normal.     Breath sounds: Normal breath sounds.  Abdominal:     Palpations: Abdomen is soft.     Tenderness: There is no abdominal tenderness. There is no guarding or rebound.  Musculoskeletal:        General: Normal range of motion.     Cervical back: Normal range of motion.  Lymphadenopathy:     Cervical: No cervical  adenopathy.  Neurological:     General: No focal deficit present.     Mental Status: She is alert and oriented to person, place, and time.     Cranial Nerves: No cranial nerve deficit.  Skin:    General: Skin is warm and dry.  Psychiatric:        Mood and Affect: Mood normal.        Behavior: Behavior normal.        Thought Content: Thought content normal.        Judgment: Judgment normal.  Vitals reviewed.    Assessment/Plan: Encounter for annual routine gynecological examination  Cervical cancer screening - Plan: Cytology - PAP  Screening for HPV (human papillomavirus) - Plan: Cytology - PAP  Encounter for screening mammogram for malignant neoplasm of breast; pt current on mammo  Perimenopause--f/u prn AUB.         GYN counsel breast self exam, mammography screening, menopause, adequate intake of calcium  and vitamin D , diet and exercise    F/U  No follow-ups on file.  Mia Oconnell B. Nnaemeka Samson, PA-C 01/23/2024 12:49 PM

## 2024-01-25 ENCOUNTER — Ambulatory Visit: Admitting: Obstetrics and Gynecology

## 2024-01-25 ENCOUNTER — Encounter: Payer: Self-pay | Admitting: Obstetrics and Gynecology

## 2024-01-25 VITALS — BP 133/82 | HR 163 | Ht 67.0 in | Wt 209.0 lb

## 2024-01-25 DIAGNOSIS — N951 Menopausal and female climacteric states: Secondary | ICD-10-CM | POA: Diagnosis not present

## 2024-01-25 DIAGNOSIS — Z23 Encounter for immunization: Secondary | ICD-10-CM

## 2024-01-25 DIAGNOSIS — Z1231 Encounter for screening mammogram for malignant neoplasm of breast: Secondary | ICD-10-CM

## 2024-01-25 DIAGNOSIS — Z01419 Encounter for gynecological examination (general) (routine) without abnormal findings: Secondary | ICD-10-CM

## 2024-01-25 NOTE — Patient Instructions (Signed)
 I value your feedback and you entrusting [place] with your care. If you get a [facility] [place] [facility] patient survey, I would appreciate you taking the time to let us know about your experience today. Thank you! ? ? ?

## 2024-01-29 ENCOUNTER — Ambulatory Visit (INDEPENDENT_AMBULATORY_CARE_PROVIDER_SITE_OTHER): Payer: Self-pay | Admitting: Internal Medicine

## 2024-01-29 ENCOUNTER — Encounter (INDEPENDENT_AMBULATORY_CARE_PROVIDER_SITE_OTHER): Payer: Self-pay | Admitting: Internal Medicine

## 2024-01-29 VITALS — BP 121/81 | HR 83 | Temp 98.5°F | Ht 67.0 in | Wt 203.0 lb

## 2024-01-29 DIAGNOSIS — R948 Abnormal results of function studies of other organs and systems: Secondary | ICD-10-CM

## 2024-01-29 DIAGNOSIS — Z6831 Body mass index (BMI) 31.0-31.9, adult: Secondary | ICD-10-CM

## 2024-01-29 DIAGNOSIS — I10 Essential (primary) hypertension: Secondary | ICD-10-CM

## 2024-01-29 DIAGNOSIS — E782 Mixed hyperlipidemia: Secondary | ICD-10-CM | POA: Diagnosis not present

## 2024-01-29 DIAGNOSIS — R638 Other symptoms and signs concerning food and fluid intake: Secondary | ICD-10-CM

## 2024-01-29 DIAGNOSIS — E66811 Obesity, class 1: Secondary | ICD-10-CM

## 2024-01-29 MED ORDER — TIRZEPATIDE-WEIGHT MANAGEMENT 5 MG/0.5ML ~~LOC~~ SOAJ: 5.0000 mg | SUBCUTANEOUS | 2 refills | Status: DC

## 2024-01-29 NOTE — Progress Notes (Signed)
 Office: [phone]  /  Fax: [phone]  Weight Summary and Body Composition Analysis (BIA)  Vitals Temp: 98.5 F (36.9 C) BP: 121/81 Pulse Rate: 83 SpO2: 100 %   Anthropometric Measurements Height: 5' 7 (1.702 m) Weight: 203 lb (92.1 kg) BMI (Calculated): 31.79 Weight at Last Visit: 206 lb Weight Lost Since Last Visit: 3 lb Weight Gained Since Last Visit: 0 lb Starting Weight: 238 lb Total Weight Loss (lbs): 35 lb (15.9 kg) Peak Weight: 247 lb   Body Composition  Body Fat %: 41.5 % Fat Mass (lbs): 84.2 lbs Muscle Mass (lbs): 112.8 lbs Total Body Water (lbs): 78 lbs Visceral Fat Rating : 10    RMR: 1685  Today's Visit #: 14  Starting Date: 08/17/22   Subjective   Chief Complaint: Obesity  Interval History Discussed the use of AI scribe software for clinical note transcription with the patient, who gave verbal consent to proceed.  History of Present Illness Mia Oconnell is a 58 year old female who presents for medical weight management.  She has lost three pounds since her last visit and adheres to a 1200 calorie nutrition target approximately 75% of the time. Her exercise routine includes five days a week of cardio and strength training, with sessions lasting 30 to 45 minutes. She has also incorporated a two-day-a-week Pilates plan into her regimen.  Her body fat percentage has decreased to 41%, with a goal of reaching 34%. She is focused on reducing body fat and improving her medical conditions rather than achieving a specific weight. She targets 1200 calories and 90 grams of protein per day.  She has noticed improvements in her physical strength, particularly in her legs and core, and feels stronger overall. No issues with appetite control and she feels satiated between meals. She has not experienced any side effects such as nausea or constipation.  She has a family history of Alzheimer's disease, with her mother affected by the condition. She  attended an Alzheimer's presentation and expressed concern about cortisol levels as a potential risk factor. She inquired about testing for cortisol levels and discussed the implications of genetic testing for Alzheimer's.  She plans to monitor her weight weekly during the holiday season to ensure she maintains her progress.     Challenges affecting patient progress: none.    Pharmacotherapy for weight management: She is currently taking Zepbound  with adequate clinical response  and without side effects..   Assessment and Plan   Treatment Plan For Obesity:  Recommended Dietary Goals  Mia Oconnell is currently in the action stage of change. As such, her goal is to continue weight management plan. She has agreed to: continue current plan  Behavioral Health and Counseling  We discussed the following behavioral modification strategies today: continue to work on maintaining a reduced calorie state, getting the recommended amount of protein, incorporating whole foods, making healthy choices, staying well hydrated and practicing mindfulness when eating. and increase protein intake, fibrous foods (25 grams per day for women, 30 grams for men) and water to improve satiety and decrease hunger signals. .  Additional education and resources provided today: None  Recommended Physical Activity Goals  Mia Oconnell has been advised to work up to 150 minutes of moderate intensity aerobic activity a week and strengthening exercises 2-3 times per week for cardiovascular health, weight loss maintenance and preservation of muscle mass.  She has agreed to :  Think about enjoyable ways to increase daily physical activity and overcoming barriers to exercise, Increase physical activity  in their day and reduce sedentary time (increase NEAT)., Increase volume of physical activity to a goal of 240 minutes a week, and Combine aerobic and strengthening exercises for efficiency and improved cardiometabolic  health.  Medical Interventions and Pharmacotherapy  We discussed various medication options to help Mia Oconnell with her weight loss efforts and we both agreed to : Adequate clinical response to anti-obesity medication, continue current regimen  Associated Conditions Impacted by Obesity Treatment  Assessment & Plan Hypertension, unspecified type Vitals:   01/29/24 1200  BP: 121/81    Blood pressure is at goal for age and risk category.  On amlodipine  without adverse effects.  Continue current regimen  Mixed hyperlipidemia LDL is at goal.  She is on atorvastatin  without any adverse effects.  Elevated LDL may be secondary to nutrition, genetics and spillover effect from excess adiposity. Recommended LDL goal is <70 to reduce the risk of fatty streaks and the progression to obstructive ASCVD in the future. Her 10 year risk is: The 10-year ASCVD risk score (Arnett DK, et al., 2019) is: 3.1%  Lab Results  Component Value Date   CHOL 146 07/11/2023   HDL 67.80 07/11/2023   LDLCALC 66 07/11/2023   TRIG 60.0 07/11/2023   CHOLHDL 2 07/11/2023    Continue weight loss therapy, losing 10% or more of body weight may improve condition.  Continue atorvastatin     Abnormal metabolism Patient has a slower than predicted metabolism. IC 1685 vs. calculated 1807. This may contribute to weight gain, chronic fatigue and difficulty losing weight.  She continues to increase volume of physical activity  Abnormal food appetite Stable.  Now on Zepbound  5 mg once a week.  We again discussed the role of protein and fiber in satiation.  Continue medication  Class 1 obesity with serious comorbidity and body mass index (BMI) of 31.0 to 31.9 in adult, unspecified obesity type Weight: decrease of 42.4 lb (17.3%) over 11 months, 3 weeks  Start: 02/06/2023 245 lb 6.4 oz (111.3 kg)  End: 01/29/2024 203 lb (92.1 kg)    She has lost three pounds since the last visit and is following a 1200 calorie nutrition target  75% of the time. Engaging in cardio and strength training five days a week. Body fat percentage has decreased to 41%, with a goal of 34%. Current BMI is 31, approaching the threshold of 30, which would no longer classify her as obese. The focus is on reducing visceral fat to decrease long-term health risks and increase longevity. Current medication is effective in appetite control without adverse effects. Discussed the importance of maintaining a calorie deficit and adjusting intake as weight decreases. Emphasized the role of physical activity and behavioral modifications in weight management. Discussed potential for tachyphylaxis with medication and the option to increase dosage if needed. Discussed the importance of maintaining weight during the holidays and strategies to manage weight during this period. - Continue 1200 calorie nutrition target with 90 grams of protein. - Continue current exercise regimen, including Pilates twice a week. - Monitor weight weekly to adjust calorie intake as needed. - Consider meal replacements for convenience if needed. - Maintain current medication dosage; will consider increasing if appetite control decreases. - Provided handout with holiday weight management tips. - Scheduled follow-up appointment in eight weeks.         Objective   Physical Exam:  Blood pressure 121/81, pulse 83, temperature 98.5 F (36.9 C), height 5' 7 (1.702 m), weight 203 lb (92.1 kg), last menstrual period 04/22/2023,  SpO2 100%. Body mass index is 31.79 kg/m.  General: She is overweight, cooperative, alert, well developed, and in no acute distress. PSYCH: Has normal mood, affect and thought process.   HEENT: EOMI, sclerae are anicteric. Lungs: Normal breathing effort, no conversational dyspnea. Extremities: No edema.  Neurologic: No gross sensory or motor deficits. No tremors or fasciculations noted.    Diagnostic Data Reviewed:  BMET    Component Value Date/Time   NA  141 07/11/2023 0905   NA 140 08/17/2022 0839   K 4.5 07/11/2023 0905   CL 104 07/11/2023 0905   CO2 28 07/11/2023 0905   GLUCOSE 101 (H) 07/11/2023 0905   BUN 13 07/11/2023 0905   BUN 16 08/17/2022 0839   CREATININE 0.72 07/11/2023 0905   CALCIUM  10.0 07/11/2023 0905   Lab Results  Component Value Date   HGBA1C 5.9 07/11/2023   HGBA1C 6.0 10/18/2016   Lab Results  Component Value Date   INSULIN  6.4 08/17/2022   Lab Results  Component Value Date   TSH 1.74 07/11/2023   CBC    Component Value Date/Time   WBC 8.1 07/11/2023 0905   RBC 4.63 07/11/2023 0905   HGB 14.4 07/11/2023 0905   HGB 13.8 08/17/2022 0839   HCT 42.7 07/11/2023 0905   HCT 40.7 08/17/2022 0839   PLT 227.0 07/11/2023 0905   MCV 92.1 07/11/2023 0905   MCV 93 08/17/2022 0839   MCH 31.5 08/17/2022 0839   MCHC 33.9 07/11/2023 0905   RDW 13.4 07/11/2023 0905   RDW 12.3 08/17/2022 0839   Iron Studies No results found for: IRON, TIBC, FERRITIN, IRONPCTSAT Lipid Panel     Component Value Date/Time   CHOL 146 07/11/2023 0905   CHOL 161 08/17/2022 0839   TRIG 60.0 07/11/2023 0905   HDL 67.80 07/11/2023 0905   HDL 73 08/17/2022 0839   CHOLHDL 2 07/11/2023 0905   VLDL 12.0 07/11/2023 0905   LDLCALC 66 07/11/2023 0905   LDLCALC 74 08/17/2022 0839   Hepatic Function Panel     Component Value Date/Time   PROT 7.3 07/11/2023 0905   PROT 6.8 08/17/2022 0839   ALBUMIN 4.7 07/11/2023 0905   ALBUMIN 4.5 08/17/2022 0839   AST 18 07/11/2023 0905   ALT 17 07/11/2023 0905   ALKPHOS 63 07/11/2023 0905   BILITOT 0.7 07/11/2023 0905   BILITOT 0.6 08/17/2022 0839      Component Value Date/Time   TSH 1.74 07/11/2023 0905   Nutritional Lab Results  Component Value Date   VD25OH 40.58 07/11/2023   VD25OH 38.1 08/17/2022   VD25OH 54.44 04/24/2019    Medications: Outpatient Encounter Medications as of 01/29/2024  Medication Sig   amLODipine  (NORVASC ) 5 MG tablet Take 1 tablet (5 mg total) by  mouth daily.   atorvastatin  (LIPITOR) 40 MG tablet TAKE 1 40 MG TABLET BY MOUTH EVERYDAY AT 6 PM   Cholecalciferol (VITAMIN D ) 125 MCG (5000 UT) CAPS    levocetirizine (XYZAL ALLERGY 24HR) 5 MG tablet    Multiple Vitamins-Minerals (CENTRUM SILVER 50+WOMEN) TABS    Omega-3 Fatty Acids (FISH OIL) 1000 MG CAPS    [DISCONTINUED] tirzepatide  (ZEPBOUND ) 5 MG/0.5ML Pen Inject 5 mg into the skin once a week.   tirzepatide  (ZEPBOUND ) 5 MG/0.5ML Pen Inject 5 mg into the skin once a week.   No facility-administered encounter medications on file as of 01/29/2024.     Follow-Up   Return in about 8 weeks (around 03/25/2024) for For Weight Mangement with Dr. Francyne.[email or web address]  She was informed of the importance of frequent follow up visits to maximize her success with intensive lifestyle modifications for her multiple health conditions.  Attestation Statement   Reviewed by clinician on day of visit: allergies, medications, problem list, medical history, surgical history, family history, social history, and previous encounter notes.     Lucas Parker, MD

## 2024-01-29 NOTE — Assessment & Plan Note (Signed)
 LDL is at goal.  She is on atorvastatin  without any adverse effects.  Elevated LDL may be secondary to nutrition, genetics and spillover effect from excess adiposity. Recommended LDL goal is <70 to reduce the risk of fatty streaks and the progression to obstructive ASCVD in the future. Her 10 year risk is: The 10-year ASCVD risk score (Arnett DK, et al., 2019) is: 3.1%  Lab Results  Component Value Date   CHOL 146 07/11/2023   HDL 67.80 07/11/2023   LDLCALC 66 07/11/2023   TRIG 60.0 07/11/2023   CHOLHDL 2 07/11/2023    Continue weight loss therapy, losing 10% or more of body weight may improve condition.  Continue atorvastatin 

## 2024-01-29 NOTE — Assessment & Plan Note (Signed)
 Vitals:   01/29/24 1200  BP: 121/81    Blood pressure is at goal for age and risk category.  On amlodipine  without adverse effects.  Continue current regimen

## 2024-01-29 NOTE — Assessment & Plan Note (Signed)
 Stable.  Now on Zepbound  5 mg once a week.  We again discussed the role of protein and fiber in satiation.  Continue medication

## 2024-01-29 NOTE — Assessment & Plan Note (Signed)
 Patient has a slower than predicted metabolism. IC 1685 vs. calculated 1807. This may contribute to weight gain, chronic fatigue and difficulty losing weight.  She continues to increase volume of physical activity

## 2024-01-29 NOTE — Assessment & Plan Note (Signed)
 Weight: decrease of 42.4 lb (17.3%) over 11 months, 3 weeks  Start: 02/06/2023 245 lb 6.4 oz (111.3 kg)  End: 01/29/2024 203 lb (92.1 kg)    She has lost three pounds since the last visit and is following a 1200 calorie nutrition target 75% of the time. Engaging in cardio and strength training five days a week. Body fat percentage has decreased to 41%, with a goal of 34%. Current BMI is 31, approaching the threshold of 30, which would no longer classify her as obese. The focus is on reducing visceral fat to decrease long-term health risks and increase longevity. Current medication is effective in appetite control without adverse effects. Discussed the importance of maintaining a calorie deficit and adjusting intake as weight decreases. Emphasized the role of physical activity and behavioral modifications in weight management. Discussed potential for tachyphylaxis with medication and the option to increase dosage if needed. Discussed the importance of maintaining weight during the holidays and strategies to manage weight during this period. - Continue 1200 calorie nutrition target with 90 grams of protein. - Continue current exercise regimen, including Pilates twice a week. - Monitor weight weekly to adjust calorie intake as needed. - Consider meal replacements for convenience if needed. - Maintain current medication dosage; will consider increasing if appetite control decreases. - Provided handout with holiday weight management tips. - Scheduled follow-up appointment in eight weeks.

## 2024-03-26 ENCOUNTER — Encounter (INDEPENDENT_AMBULATORY_CARE_PROVIDER_SITE_OTHER): Payer: Self-pay | Admitting: Internal Medicine

## 2024-03-27 ENCOUNTER — Encounter (INDEPENDENT_AMBULATORY_CARE_PROVIDER_SITE_OTHER): Payer: Self-pay | Admitting: Internal Medicine

## 2024-03-27 ENCOUNTER — Ambulatory Visit (INDEPENDENT_AMBULATORY_CARE_PROVIDER_SITE_OTHER): Admitting: Internal Medicine

## 2024-03-27 VITALS — BP 123/83 | HR 80 | Temp 98.7°F | Ht 67.0 in | Wt 201.0 lb

## 2024-03-27 DIAGNOSIS — R638 Other symptoms and signs concerning food and fluid intake: Secondary | ICD-10-CM | POA: Diagnosis not present

## 2024-03-27 DIAGNOSIS — I10 Essential (primary) hypertension: Secondary | ICD-10-CM | POA: Diagnosis not present

## 2024-03-27 DIAGNOSIS — E782 Mixed hyperlipidemia: Secondary | ICD-10-CM

## 2024-03-27 DIAGNOSIS — R948 Abnormal results of function studies of other organs and systems: Secondary | ICD-10-CM

## 2024-03-27 DIAGNOSIS — Z6831 Body mass index (BMI) 31.0-31.9, adult: Secondary | ICD-10-CM

## 2024-03-27 DIAGNOSIS — E66811 Obesity, class 1: Secondary | ICD-10-CM

## 2024-03-27 NOTE — Assessment & Plan Note (Signed)
 Patient has a slower than predicted metabolism. IC 1685 vs. calculated 1807. This may contribute to weight gain, chronic fatigue and difficulty losing weight.   She has increased PA.   Repeat indirect calorimetry at the next visit.

## 2024-03-27 NOTE — Telephone Encounter (Signed)
 Pt coming in today, will do PA once Rx is sentt

## 2024-03-27 NOTE — Assessment & Plan Note (Signed)
 LDL is at goal.  She is on atorvastatin  without any adverse effects.  Elevated LDL may be secondary to nutrition, genetics and spillover effect from excess adiposity. Recommended LDL goal is <70 to reduce the risk of fatty streaks and the progression to obstructive ASCVD in the future. Her 10 year risk is: The 10-year ASCVD risk score (Arnett DK, et al., 2019) is: 3.2%  Lab Results  Component Value Date   CHOL 146 07/11/2023   HDL 67.80 07/11/2023   LDLCALC 66 07/11/2023   TRIG 60.0 07/11/2023   CHOLHDL 2 07/11/2023    Continue weight loss therapy, losing 10% or more of body weight may improve condition.  Continue atorvastatin 

## 2024-03-27 NOTE — Assessment & Plan Note (Signed)
 Weight: decrease of 44.4 lb (18.1%) over 1 year, 1 month  Start: 02/06/2023 245 lb 6.4 oz (111.3 kg)  End: 03/27/2024 201 lb (91.2 kg)  Management is ongoing with a focus on weight loss and maintenance. She has lost two pounds and is on a 5 mg dose of GLP-1 medication, which she feels is effective in suppressing appetite. Discussed the body's natural tendency to resist weight loss by increasing ghrelin and decreasing leptin levels, as well as the potential for metabolic rate changes with weight loss. She is considering further weight loss but is aware of the challenges associated with maintaining a calorie deficit as weight decreases. Discussed the potential for a drug plateau with GLP-1 medications around 14-16 months, but she is still early in her treatment course. - Increase Zepbound  to 7.5 mg once a week. - Trial 1000 calorie diet for four weeks. - Use meal planning tools to assist with calorie management.  Pharmacologic management of obesity - Use 7.5 mg GLP-1 medication if 5 mg dose is unavailable. - Monitor for any side effects such as nausea when increasing the dose.

## 2024-03-27 NOTE — Progress Notes (Signed)
 "  Office: [phone]  /  Fax: [phone]  Weight Summary and Body Composition Analysis (BIA)  Vitals Temp: 98.7 F (37.1 C) BP: 123/83 Pulse Rate: 80 SpO2: 99 %   Anthropometric Measurements Height: 5' 7 (1.702 m) Weight: 201 lb (91.2 kg) BMI (Calculated): 31.47 Weight at Last Visit: 203 lb Weight Lost Since Last Visit: 2 lb Weight Gained Since Last Visit: 0 lb Starting Weight: 238 lb Total Weight Loss (lbs): 37 lb (16.8 kg) Peak Weight: 247 lb   Body Composition  Body Fat %: 4 % Fat Mass (lbs): 84.8 lbs Muscle Mass (lbs): 111 lbs Total Body Water (lbs): 81.8 lbs Visceral Fat Rating : 11    RMR: 1685  Today's Visit #: 15  Starting Date: 08/17/22   Subjective   Chief Complaint: Obesity  Interval History  Discussed the use of AI scribe software for clinical note transcription with the patient, who gave verbal consent to proceed.  History of Present Illness Mia Oconnell is a 59 year old female who presents for weight management and evaluation of her current weight loss regimen. Mia Oconnell is following a 1200 low carb target with good adherence.   Mia Oconnell has experienced a weight loss of two pounds since her last visit. Mia Oconnell feels the progress is slow but acknowledges that her current medication is effective at suppressing her appetite. Mia Oconnell is on a 5 mg dose and is not experiencing hunger.  Mia Oconnell discusses the challenges of maintaining a calorie deficit as Mia Oconnell continues to lose weight, noting that her energy output through exercise has already been maximized. Mia Oconnell is concerned about the need to further reduce her caloric intake to continue losing weight, especially given her age and slower metabolic rate.  Mia Oconnell has been on her current medication for almost a year and is considering whether Mia Oconnell might be developing a tolerance to the 5 mg dose. However, Mia Oconnell reports no changes in her hunger signals and feels that the medication is still effective.  Mia Oconnell mentions having a  slower metabolic rate and is considering a repeat of her metabolic rate test to better understand her current caloric needs. Mia Oconnell is aware that her metabolic rate may have decreased due to age and previous low-calorie diets, which may have affected her muscle mass.  Mia Oconnell is contemplating whether to reduce her caloric intake to 1000 calories per day to continue her weight loss or to maintain her current weight, acknowledging that 1200 calories is already a low intake for her. Mia Oconnell is considering using a higher dose of her medication if Mia Oconnell finds it difficult to maintain a lower calorie intake without feeling hungry.  Mia Oconnell has a box of 7.5 mg medication at home and is considering using it if her current 5 mg dose runs out before Mia Oconnell can obtain a new prescription. Mia Oconnell is open to trying the higher dose to see if it helps her maintain a lower calorie intake without increased hunger.     Challenges affecting patient progress: none.    Pharmacotherapy for weight management: Mia Oconnell is currently taking Zepbound  with adequate clinical response  and without side effects..   Assessment and Plan   Treatment Plan For Obesity:  Recommended Dietary Goals  Mia Oconnell is currently in the action stage of change. As such, her goal is to continue weight management plan. Mia Oconnell has agreed to: keep a food journal with a target of  1000 calories per day and 75 grams of protein per day or  25 grams per meal.  Behavioral  Health and Counseling  We discussed the following behavioral modification strategies today: continue to work on maintaining a reduced calorie state, getting the recommended amount of protein, incorporating whole foods, making healthy choices, staying well hydrated and practicing mindfulness when eating. and increase protein intake, fibrous foods (25 grams per day for women, 30 grams for men) and water to improve satiety and decrease hunger signals. .  Additional education and resources provided  today: None  Recommended Physical Activity Goals  Mia Oconnell has been advised to work up to 150 minutes of moderate intensity aerobic activity a week and strengthening exercises 2-3 times per week for cardiovascular health, weight loss maintenance and preservation of muscle mass.  Mia Oconnell has agreed to :  Continue current level of physical activity   Medical Interventions and Pharmacotherapy  We discussed various medication options to help Mia Oconnell with her weight loss efforts and we both agreed to : Increase Zepbound  to 7.5 mg once a week.  Associated Conditions Impacted by Obesity Treatment  Assessment & Plan Abnormal metabolism Patient has a slower than predicted metabolism. IC 1685 vs. calculated 1807. This may contribute to weight gain, chronic fatigue and difficulty losing weight.   Mia Oconnell has increased PA.   Repeat indirect calorimetry at the next visit.   Class 1 obesity with serious comorbidity and body mass index (BMI) of 31.0 to 31.9 in adult, unspecified obesity type Weight: decrease of 44.4 lb (18.1%) over 1 year, 1 month  Start: 02/06/2023 245 lb 6.4 oz (111.3 kg)  End: 03/27/2024 201 lb (91.2 kg)  Management is ongoing with a focus on weight loss and maintenance. Mia Oconnell has lost two pounds and is on a 5 mg dose of GLP-1 medication, which Mia Oconnell feels is effective in suppressing appetite. Discussed the body's natural tendency to resist weight loss by increasing ghrelin and decreasing leptin levels, as well as the potential for metabolic rate changes with weight loss. Mia Oconnell is considering further weight loss but is aware of the challenges associated with maintaining a calorie deficit as weight decreases. Discussed the potential for a drug plateau with GLP-1 medications around 14-16 months, but Mia Oconnell is still early in her treatment course. - Increase Zepbound  to 7.5 mg once a week. - Trial 1000 calorie diet for four weeks. - Use meal planning tools to assist with calorie  management.  Pharmacologic management of obesity - Use 7.5 mg GLP-1 medication if 5 mg dose is unavailable. - Monitor for any side effects such as nausea when increasing the dose.  Abnormal food appetite Stable.  Increase Zepbound  to 7.5 mg once a week to assist in maintaining a RCNP.   Hypertension, unspecified type Vitals:   03/27/24 1200  BP: 123/83    Blood pressure is at goal for age and risk category.  On amlodipine  without adverse effects.  Continue current regimen  Mixed hyperlipidemia LDL is at goal.  Mia Oconnell is on atorvastatin  without any adverse effects.  Elevated LDL may be secondary to nutrition, genetics and spillover effect from excess adiposity. Recommended LDL goal is <70 to reduce the risk of fatty streaks and the progression to obstructive ASCVD in the future. Her 10 year risk is: The 10-year ASCVD risk score (Arnett DK, et al., 2019) is: 3.2%  Lab Results  Component Value Date   CHOL 146 07/11/2023   HDL 67.80 07/11/2023   LDLCALC 66 07/11/2023   TRIG 60.0 07/11/2023   CHOLHDL 2 07/11/2023    Continue weight loss therapy, losing 10% or more of body weight  may improve condition.  Continue atorvastatin         Objective   Physical Exam:  Blood pressure 123/83, pulse 80, temperature 98.7 F (37.1 C), height 5' 7 (1.702 m), weight 201 lb (91.2 kg), SpO2 99%. Body mass index is 31.48 kg/m.  General: Mia Oconnell is overweight, cooperative, alert, well developed, and in no acute distress. PSYCH: Has normal mood, affect and thought process.   HEENT: EOMI, sclerae are anicteric. Lungs: Normal breathing effort, no conversational dyspnea. Extremities: No edema.  Neurologic: No gross sensory or motor deficits. No tremors or fasciculations noted.    Diagnostic Data Reviewed:  BMET    Component Value Date/Time   NA 141 07/11/2023 0905   NA 140 08/17/2022 0839   K 4.5 07/11/2023 0905   CL 104 07/11/2023 0905   CO2 28 07/11/2023 0905   GLUCOSE 101 (H) 07/11/2023  0905   BUN 13 07/11/2023 0905   BUN 16 08/17/2022 0839   CREATININE 0.72 07/11/2023 0905   CALCIUM  10.0 07/11/2023 0905   Lab Results  Component Value Date   HGBA1C 5.9 07/11/2023   HGBA1C 6.0 10/18/2016   Lab Results  Component Value Date   INSULIN  6.4 08/17/2022   Lab Results  Component Value Date   TSH 1.74 07/11/2023   CBC    Component Value Date/Time   WBC 8.1 07/11/2023 0905   RBC 4.63 07/11/2023 0905   HGB 14.4 07/11/2023 0905   HGB 13.8 08/17/2022 0839   HCT 42.7 07/11/2023 0905   HCT 40.7 08/17/2022 0839   PLT 227.0 07/11/2023 0905   MCV 92.1 07/11/2023 0905   MCV 93 08/17/2022 0839   MCH 31.5 08/17/2022 0839   MCHC 33.9 07/11/2023 0905   RDW 13.4 07/11/2023 0905   RDW 12.3 08/17/2022 0839   Iron Studies No results found for: IRON, TIBC, FERRITIN, IRONPCTSAT Lipid Panel     Component Value Date/Time   CHOL 146 07/11/2023 0905   CHOL 161 08/17/2022 0839   TRIG 60.0 07/11/2023 0905   HDL 67.80 07/11/2023 0905   HDL 73 08/17/2022 0839   CHOLHDL 2 07/11/2023 0905   VLDL 12.0 07/11/2023 0905   LDLCALC 66 07/11/2023 0905   LDLCALC 74 08/17/2022 0839   Hepatic Function Panel     Component Value Date/Time   PROT 7.3 07/11/2023 0905   PROT 6.8 08/17/2022 0839   ALBUMIN 4.7 07/11/2023 0905   ALBUMIN 4.5 08/17/2022 0839   AST 18 07/11/2023 0905   ALT 17 07/11/2023 0905   ALKPHOS 63 07/11/2023 0905   BILITOT 0.7 07/11/2023 0905   BILITOT 0.6 08/17/2022 0839      Component Value Date/Time   TSH 1.74 07/11/2023 0905   Nutritional Lab Results  Component Value Date   VD25OH 40.58 07/11/2023   VD25OH 38.1 08/17/2022   VD25OH 54.44 04/24/2019    Medications: Outpatient Encounter Medications as of 03/27/2024  Medication Sig   amLODipine  (NORVASC ) 5 MG tablet Take 1 tablet (5 mg total) by mouth daily.   atorvastatin  (LIPITOR) 40 MG tablet TAKE 1 40 MG TABLET BY MOUTH EVERYDAY AT 6 PM   Cholecalciferol (VITAMIN D ) 125 MCG (5000 UT) CAPS     levocetirizine (XYZAL ALLERGY 24HR) 5 MG tablet    Multiple Vitamins-Minerals (CENTRUM SILVER 50+WOMEN) TABS    Omega-3 Fatty Acids (FISH OIL) 1000 MG CAPS    tirzepatide  (ZEPBOUND ) 5 MG/0.5ML Pen Inject 5 mg into the skin once a week.   No facility-administered encounter medications on file as of  03/27/2024.     Follow-Up   Return in about 4 weeks (around 04/24/2024) for Fasting and 30 minutes early for IC.SABRA Mia Oconnell was informed of the importance of frequent follow up visits to maximize her success with intensive lifestyle modifications for her multiple health conditions.  Attestation Statement   Reviewed by clinician on day of visit: allergies, medications, problem list, medical history, surgical history, family history, social history, and previous encounter notes.     Lucas Parker, MD  "

## 2024-03-27 NOTE — Assessment & Plan Note (Signed)
 Stable.  Increase Zepbound  to 7.5 mg once a week to assist in maintaining a RCNP.

## 2024-03-27 NOTE — Assessment & Plan Note (Signed)
 Vitals:   03/27/24 1200  BP: 123/83    Blood pressure is at goal for age and risk category.  On amlodipine  without adverse effects.  Continue current regimen

## 2024-04-01 ENCOUNTER — Other Ambulatory Visit (INDEPENDENT_AMBULATORY_CARE_PROVIDER_SITE_OTHER): Payer: Self-pay

## 2024-04-01 DIAGNOSIS — E1169 Type 2 diabetes mellitus with other specified complication: Secondary | ICD-10-CM

## 2024-04-01 MED ORDER — ZEPBOUND 7.5 MG/0.5ML ~~LOC~~ SOAJ: 7.5000 mg | SUBCUTANEOUS | 2 refills | Status: AC

## 2024-04-24 ENCOUNTER — Encounter (INDEPENDENT_AMBULATORY_CARE_PROVIDER_SITE_OTHER): Payer: Self-pay | Admitting: Internal Medicine

## 2024-04-24 ENCOUNTER — Ambulatory Visit (INDEPENDENT_AMBULATORY_CARE_PROVIDER_SITE_OTHER): Admitting: Internal Medicine

## 2024-04-24 VITALS — BP 115/80 | HR 73 | Temp 97.8°F | Ht 67.0 in | Wt 195.0 lb

## 2024-04-24 DIAGNOSIS — Z6830 Body mass index (BMI) 30.0-30.9, adult: Secondary | ICD-10-CM

## 2024-04-24 DIAGNOSIS — E66811 Obesity, class 1: Secondary | ICD-10-CM

## 2024-04-24 DIAGNOSIS — Z6831 Body mass index (BMI) 31.0-31.9, adult: Secondary | ICD-10-CM

## 2024-04-24 DIAGNOSIS — R0602 Shortness of breath: Secondary | ICD-10-CM | POA: Diagnosis not present

## 2024-04-24 DIAGNOSIS — E782 Mixed hyperlipidemia: Secondary | ICD-10-CM

## 2024-04-24 DIAGNOSIS — R638 Other symptoms and signs concerning food and fluid intake: Secondary | ICD-10-CM | POA: Diagnosis not present

## 2024-04-24 DIAGNOSIS — R948 Abnormal results of function studies of other organs and systems: Secondary | ICD-10-CM

## 2024-04-24 DIAGNOSIS — I10 Essential (primary) hypertension: Secondary | ICD-10-CM | POA: Diagnosis not present

## 2024-04-24 MED ORDER — AMLODIPINE BESYLATE 2.5 MG PO TABS: 2.5000 mg | ORAL_TABLET | Freq: Every day | ORAL | 1 refills | Status: AC

## 2024-04-24 NOTE — Progress Notes (Signed)
 "  Office: [phone]  /  Fax: [phone]  Weight Summary and Body Composition Analysis (BIA)  Vitals Temp: 97.8 F (36.6 C) BP: 115/80 Pulse Rate: 73 SpO2: 100 %   Anthropometric Measurements Height: 5' 7 (1.702 m) Weight: 195 lb (88.5 kg) BMI (Calculated): 30.53 Weight at Last Visit: 201 lb Weight Lost Since Last Visit: 6 lb Weight Gained Since Last Visit: 0 lb Starting Weight: 238 lb Total Weight Loss (lbs): 43 lb (19.5 kg) Peak Weight: 247 lb   Body Composition  Body Fat %: 40.2 % Fat Mass (lbs): 78.4 lbs Muscle Mass (lbs): 110.8 lbs Total Body Water (lbs): 77.4 lbs Visceral Fat Rating : 10    RMR: 1354  Today's Visit #: 16  Starting Date: 08/17/23   Subjective   Chief Complaint: Obesity  Interval History  Discussed the use of AI scribe software for clinical note transcription with the patient, who gave verbal consent to proceed.  History of Present Illness Mia Oconnell is a 59 year old female who presents for medical weight management.  She has lost six pounds since her last visit, attributing this to adherence to a 1000-calorie nutrition plan approximately 80% of the time and engaging in physical activity two to four days a week, including 30 to 50 minutes of cardio and strengthening exercises.  She experiences occasional headaches, particularly when she goes five or six hours without eating, which she associates with the increased dosage of her weight management medication. Despite these headaches, she feels the weight loss is worth it and does not wish to change her current regimen.  She is currently taking amlodipine  for blood pressure management but has not been consistently monitoring her blood pressure at home. She notes that her blood pressure has decreased, with recent readings around 115.  Her insurance now requires participation in a program called [facility] for those on GLP-1 medications. This program involves online coaching  sessions, which she is attending despite feeling she receives adequate support from her current weight management program.  Her basal metabolic rate was recently measured at 1354, down from a previous rate of 1685, reflecting her weight loss. She acknowledges her slow metabolic rate and the need for a calorie deficit to achieve weight loss, aiming to maintain a daily intake close to 1000 calories.     Challenges affecting patient progress: slow metabolism for age.    Pharmacotherapy for weight management: She is currently taking Zepbound  with adequate clinical response  and without side effects..   Assessment and Plan   Treatment Plan For Obesity:  Recommended Dietary Goals  Mia Oconnell is currently in the action stage of change. As such, her goal is to continue weight management plan. She has agreed to: follow a balanced (30%/40%/30%), whole foods-based, reduced-calorie meal plan (RCNP) targeting 1000 kcal per day, incorporate prepackaged healthy meals for convenience, and incorporate 1-2 meal replacements a day for convenience   Behavioral Health and Counseling  We discussed the following behavioral modification strategies today: continue to practice mindfulness when eating and planning for success.  Additional education and resources provided today: None  Recommended Physical Activity Goals  Mia Oconnell has been advised to work up to 150 minutes of moderate intensity aerobic activity a week and strengthening exercises 2-3 times per week for cardiovascular health, weight loss maintenance and preservation of muscle mass.  She has agreed to :  Increase volume of physical activity to a goal of 240 minutes a week and Combine aerobic and strengthening exercises for efficiency  and improved cardiometabolic health.  Medical Interventions and Pharmacotherapy  We discussed various medication options to help Mia Oconnell with her weight loss efforts and we both agreed to : Adequate clinical response  to anti-obesity medication, continue current anti-obesity regimen  Associated Conditions Impacted by Obesity Treatment  Assessment & Plan Hypertension, unspecified type Management is being adjusted due to weight loss and medication effects. Blood pressure has decreased to 115 mmHg, likely due to weight loss and the effects of Tirzepatide . Amlodipine  dosage is being reduced to 2.5 mg to prevent hypotension and associated symptoms such as lightheadedness and fatigue. Blood pressure monitoring is advised to determine if further reduction or discontinuation of medication is appropriate. - Reduced Amlodipine  to 2.5 mg oral daily. - Monitor blood pressure regularly to assess the need for further medication adjustment. - If blood pressure remains under 120/80 mmHg, consider discontinuing Amlodipine . - Ensure adequate hydration and regular meals to prevent hypotension symptoms. Class 1 obesity with serious comorbidity and body mass index (BMI) of 31.0 to 31.9 in adult, unspecified obesity type Management is ongoing with a focus on calorie deficit and physical activity. She has lost six pounds since the last visit, following a 1000 calorie nutrition plan 80% of the time and exercising 2-4 days a week. She is on Tirzepatide  (Zepbound ) 7.5 mg, which has been effective in reducing hunger and aiding weight loss. Occasional headaches are noted, possibly due to prolonged fasting periods. The goal is to maintain a calorie deficit while ensuring adequate nutrition and muscle mass preservation. She is advised to stay under 1350 calories to continue weight loss, with flexibility to adjust based on hunger cues. The importance of plant-based nutrition and mindfulness eating is emphasized to support nutritional requirements and prevent muscle loss. - Continue Tirzepatide  (Zepbound ) 7.5 mg subcutaneous once a week. - Maintain a calorie intake of 1000-1350 calories per day, adjusting based on hunger cues. - Encouraged  physical activity 2-4 days a week, focusing on cardio and strengthening exercises. - Consider meal replacements with side salads or fruits to meet nutritional needs. - Monitor for signs of muscle loss or nutritional deficiencies. - Encouraged mindfulness eating and hydration. SOB (shortness of breath) on exertion Abnormal metabolism Resting energy expenditure was 1354 calculated basal metabolic rate was 1603 so current REE is slower than calculated basal metabolic rate.  Degree of slowing is comparable to when she started slow not augmented.  Her weight loss alone is therefore between 1300 and 1000 cal.  Her weight loss has recently picked up since adjusting caloric intake Mixed hyperlipidemia On atorvastatin  40 mg a day.  Continue current regimen Abnormal food appetite Improved on higher dose of Zepbound .  We educated patient on metabolic adaptations associated with weight management including changes in metabolic rate and hunger hormones.          Objective   Physical Exam:  Blood pressure 115/80, pulse 73, temperature 97.8 F (36.6 C), height 5' 7 (1.702 m), weight 195 lb (88.5 kg), SpO2 100%. Body mass index is 30.54 kg/m.  General: She is overweight, cooperative, alert, well developed, and in no acute distress. PSYCH: Has normal mood, affect and thought process.   HEENT: EOMI, sclerae are anicteric. Lungs: Normal breathing effort, no conversational dyspnea. Extremities: No edema.  Neurologic: No gross sensory or motor deficits. No tremors or fasciculations noted.    Diagnostic Data Reviewed:  BMET    Component Value Date/Time   NA 141 07/11/2023 0905   NA 140 08/17/2022 0839   K 4.5 07/11/2023  0905   CL 104 07/11/2023 0905   CO2 28 07/11/2023 0905   GLUCOSE 101 (H) 07/11/2023 0905   BUN 13 07/11/2023 0905   BUN 16 08/17/2022 0839   CREATININE 0.72 07/11/2023 0905   CALCIUM  10.0 07/11/2023 0905   Lab Results  Component Value Date   HGBA1C 5.9 07/11/2023    HGBA1C 6.0 10/18/2016   Lab Results  Component Value Date   INSULIN  6.4 08/17/2022   Lab Results  Component Value Date   TSH 1.74 07/11/2023   CBC    Component Value Date/Time   WBC 8.1 07/11/2023 0905   RBC 4.63 07/11/2023 0905   HGB 14.4 07/11/2023 0905   HGB 13.8 08/17/2022 0839   HCT 42.7 07/11/2023 0905   HCT 40.7 08/17/2022 0839   PLT 227.0 07/11/2023 0905   MCV 92.1 07/11/2023 0905   MCV 93 08/17/2022 0839   MCH 31.5 08/17/2022 0839   MCHC 33.9 07/11/2023 0905   RDW 13.4 07/11/2023 0905   RDW 12.3 08/17/2022 0839   Iron Studies No results found for: IRON, TIBC, FERRITIN, IRONPCTSAT Lipid Panel     Component Value Date/Time   CHOL 146 07/11/2023 0905   CHOL 161 08/17/2022 0839   TRIG 60.0 07/11/2023 0905   HDL 67.80 07/11/2023 0905   HDL 73 08/17/2022 0839   CHOLHDL 2 07/11/2023 0905   VLDL 12.0 07/11/2023 0905   LDLCALC 66 07/11/2023 0905   LDLCALC 74 08/17/2022 0839   Hepatic Function Panel     Component Value Date/Time   PROT 7.3 07/11/2023 0905   PROT 6.8 08/17/2022 0839   ALBUMIN 4.7 07/11/2023 0905   ALBUMIN 4.5 08/17/2022 0839   AST 18 07/11/2023 0905   ALT 17 07/11/2023 0905   ALKPHOS 63 07/11/2023 0905   BILITOT 0.7 07/11/2023 0905   BILITOT 0.6 08/17/2022 0839      Component Value Date/Time   TSH 1.74 07/11/2023 0905   Nutritional Lab Results  Component Value Date   VD25OH 40.58 07/11/2023   VD25OH 38.1 08/17/2022   VD25OH 54.44 04/24/2019    Medications: Outpatient Encounter Medications as of 04/24/2024  Medication Sig   amLODipine  (NORVASC ) 5 MG tablet Take 1 tablet (5 mg total) by mouth daily.   atorvastatin  (LIPITOR) 40 MG tablet TAKE 1 40 MG TABLET BY MOUTH EVERYDAY AT 6 PM   Cholecalciferol (VITAMIN D ) 125 MCG (5000 UT) CAPS    levocetirizine (XYZAL ALLERGY 24HR) 5 MG tablet    Multiple Vitamins-Minerals (CENTRUM SILVER 50+WOMEN) TABS    Omega-3 Fatty Acids (FISH OIL) 1000 MG CAPS    tirzepatide  (ZEPBOUND ) 7.5  MG/0.5ML Pen Inject 7.5 mg into the skin once a week.   No facility-administered encounter medications on file as of 04/24/2024.     Follow-Up   No follow-ups on file.SABRA She was informed of the importance of frequent follow up visits to maximize her success with intensive lifestyle modifications for her multiple health conditions.  Attestation Statement   Reviewed by clinician on day of visit: allergies, medications, problem list, medical history, surgical history, family history, social history, and previous encounter notes.     Lucas Parker, MD  "

## 2024-04-24 NOTE — Assessment & Plan Note (Signed)
 Resting energy expenditure was 1354 calculated basal metabolic rate was 1603 so current REE is slower than calculated basal metabolic rate.  Degree of slowing is comparable to when she started slow not augmented.  Her weight loss alone is therefore between 1300 and 1000 cal.  Her weight loss has recently picked up since adjusting caloric intake

## 2024-04-24 NOTE — Assessment & Plan Note (Signed)
 Management is being adjusted due to weight loss and medication effects. Blood pressure has decreased to 115 mmHg, likely due to weight loss and the effects of Tirzepatide . Amlodipine  dosage is being reduced to 2.5 mg to prevent hypotension and associated symptoms such as lightheadedness and fatigue. Blood pressure monitoring is advised to determine if further reduction or discontinuation of medication is appropriate. - Reduced Amlodipine  to 2.5 mg oral daily. - Monitor blood pressure regularly to assess the need for further medication adjustment. - If blood pressure remains under 120/80 mmHg, consider discontinuing Amlodipine . - Ensure adequate hydration and regular meals to prevent hypotension symptoms.

## 2024-04-24 NOTE — Assessment & Plan Note (Signed)
 Management is ongoing with a focus on calorie deficit and physical activity. She has lost six pounds since the last visit, following a 1000 calorie nutrition plan 80% of the time and exercising 2-4 days a week. She is on Tirzepatide  (Zepbound ) 7.5 mg, which has been effective in reducing hunger and aiding weight loss. Occasional headaches are noted, possibly due to prolonged fasting periods. The goal is to maintain a calorie deficit while ensuring adequate nutrition and muscle mass preservation. She is advised to stay under 1350 calories to continue weight loss, with flexibility to adjust based on hunger cues. The importance of plant-based nutrition and mindfulness eating is emphasized to support nutritional requirements and prevent muscle loss. - Continue Tirzepatide  (Zepbound ) 7.5 mg subcutaneous once a week. - Maintain a calorie intake of 1000-1350 calories per day, adjusting based on hunger cues. - Encouraged physical activity 2-4 days a week, focusing on cardio and strengthening exercises. - Consider meal replacements with side salads or fruits to meet nutritional needs. - Monitor for signs of muscle loss or nutritional deficiencies. - Encouraged mindfulness eating and hydration.

## 2024-04-24 NOTE — Assessment & Plan Note (Signed)
 On atorvastatin  40 mg a day.  Continue current regimen

## 2024-04-24 NOTE — Assessment & Plan Note (Signed)
 Improved on higher dose of Zepbound .  We educated patient on metabolic adaptations associated with weight management including changes in metabolic rate and hunger hormones.

## 2024-06-10 ENCOUNTER — Ambulatory Visit (INDEPENDENT_AMBULATORY_CARE_PROVIDER_SITE_OTHER): Admitting: Internal Medicine

## 2024-07-11 ENCOUNTER — Encounter: Admitting: Family
